# Patient Record
Sex: Male | Born: 1955
Health system: Southern US, Community
[De-identification: ages and names within clinical notes are randomized; demographics above are authoritative.]

## PROBLEM LIST (undated history)

## (undated) ENCOUNTER — Emergency Department (HOSPITAL_COMMUNITY): Admission: EM | Payer: Medicare PPO | Source: Home / Self Care

## (undated) DIAGNOSIS — K219 Gastro-esophageal reflux disease without esophagitis: Secondary | ICD-10-CM

## (undated) DIAGNOSIS — I4891 Unspecified atrial fibrillation: Secondary | ICD-10-CM

## (undated) DIAGNOSIS — I10 Essential (primary) hypertension: Secondary | ICD-10-CM

## (undated) DIAGNOSIS — R079 Chest pain, unspecified: Secondary | ICD-10-CM

## (undated) DIAGNOSIS — Z973 Presence of spectacles and contact lenses: Secondary | ICD-10-CM

## (undated) HISTORY — PX: CHOLECYSTECTOMY: SHX55

## (undated) HISTORY — DX: Gastro-esophageal reflux disease without esophagitis: K21.9

## (undated) HISTORY — DX: Chest pain, unspecified: R07.9

## (undated) HISTORY — DX: Essential (primary) hypertension: I10

---

## 1988-07-13 HISTORY — PX: EXTENSOR TENDON OF FOREARM / WRIST REPAIR: SHX1547

## 2009-07-13 DIAGNOSIS — R079 Chest pain, unspecified: Secondary | ICD-10-CM

## 2009-07-13 HISTORY — DX: Chest pain, unspecified: R07.9

## 2010-03-13 ENCOUNTER — Ambulatory Visit: Payer: Self-pay | Admitting: Internal Medicine

## 2010-03-13 ENCOUNTER — Observation Stay (HOSPITAL_COMMUNITY): Admission: EM | Admit: 2010-03-13 | Discharge: 2010-03-14 | Payer: Self-pay | Admitting: Emergency Medicine

## 2010-04-10 ENCOUNTER — Ambulatory Visit: Payer: Self-pay | Admitting: Cardiology

## 2010-04-10 ENCOUNTER — Encounter: Payer: Self-pay | Admitting: Adult Health

## 2010-08-13 NOTE — Assessment & Plan Note (Signed)
Summary: NPH CHEST PAIN   Visit Type:  Initial Consult Primary Provider:  Dr.Fusco  CC:  chest pain.  History of Present Illness: [name] is a very pleasant 55 y/o CM we are seeing on consultation at the request of Dr. Sherwood Gambler for uncontrolled hypertension.  He has no history of CAD and is not see a cardiologist in the past.  He states he as had high blood pressure for years and is intolerant to several medications.  He was recently admitted to [facility] for hypertensive urgency and chest pain.  He was ruled out for myocardial ischemia and prescribed toprol 25mg  daily and HCTZ 25mg  daily.  He also exhibited symptoms of GERD and was started on priolosec.  He is without complaint today and has been asymptomatic since discharge.   Preventive Screening-Counseling & Management  Alcohol-Tobacco     Alcohol drinks/day: <1     Smoking Status: never  Caffeine-Diet-Exercise     Does Patient Exercise: no  Current Medications (verified): 1)  Aspir-Low 81 Mg Tbec (Aspirin) .... Take 1 Tab Daily 2)  Hydrochlorothiazide 25 Mg Tabs (Hydrochlorothiazide) .... Take 1 Tab Daily 3)  Toprol Xl 25 Mg Xr24h-Tab (Metoprolol Succinate) .... Take 1 Tab Daily  Allergies (verified): 1)  ! * Tetanus  Comments:  Nurse/Medical Assistant: patient brought med list reviewed with patient   Past History:  Past medical, surgical, family and social histories (including risk factors) reviewed, and no changes noted (except as noted below).  Past Medical History: Reviewed history from 04/07/2010 and no changes required. noncardiac chest pain malignant hypertension with element of noncompliance gerds hyperlipidemia  Past Surgical History: Reviewed history from 04/07/2010 and no changes required. no surgical history noted  Family History: Reviewed history from 04/07/2010 and no changes required. Father:myocardial infarction at age 63  Social History: Reviewed history from 04/07/2010 and no changes  required. Full Time(heading and air) Married  Tobacco Use - No.  Alcohol Use - no Regular Exercise - no Drug Use - no Alcohol drinks/day:  <1  Review of Systems       All other systems have been reviewed and are negative unless stated above.   Vital Signs:  Patient profile:   55 year old male Height:      74 inches Weight:      221 pounds BMI:     28.48 Pulse rate:   61 / minute BP sitting:   144 / 88  (right arm)  Vitals Entered By: Dreama Saa, CNA (April 10, 2010 11:32 AM)  Physical Exam  General:  Well developed, well nourished, in no acute distress. Head:  normocephalic and atraumatic Mouth:  Teeth, gums and palate normal. Oral mucosa normal. Lungs:  Clear bilaterally to auscultation and percussion. Heart:  Non-displaced PMI, chest non-tender; regular rate and rhythm, S1, S2 without murmurs, rubs or gallops. Carotid upstroke normal, no bruit. Normal abdominal aortic size, no bruits. Femorals normal pulses, no bruits. Pedals normal pulses. No edema, no varicosities. Abdomen:  Bowel sounds positive; abdomen soft and non-tender without masses, organomegaly, or hernias noted. No hepatosplenomegaly. Msk:  Back normal, normal gait. Muscle strength and tone normal. Pulses:  pulses normal in all 4 extremities Extremities:  No clubbing or cyanosis. Neurologic:  Alert and oriented x 3. Psych:  Normal affect.   EKG  Procedure date:  04/10/2010  Findings:      Normal sinus rhythm with rate of:65 bpm  Left ventricular hypertrophy.    Impression & Recommendations:  Problem # 1:  HYPERTENSION, BENIGN (ICD-401.1) Calvin Fernandez appears to be well controlled at present on current mediations.  He did not have echo or stress test while recently hospitalized and is reluctant to proceed with more testing.  He has been counselled by me and by Dr. Dietrich Pates on need to keep BP well controlled and the effects this can have on his overall health status.  After discussion with Dr.  Dietrich Pates, the patient agrees to proceed with exercise stress test only. We will  also do a urinalysis and BMET to evaluate renal fx and potassium status. Dr. Dietrich Pates has advised him to stop potassium replacement at this time. He will follow up with [place] after tests.  Rx refills for Toprol and HCTZ are provided. His updated medication list for this problem includes:    Aspir-low 81 Mg Tbec (Aspirin) .Marland Kitchen... Take 1 tab daily    Hydrochlorothiazide 25 Mg Tabs (Hydrochlorothiazide) .Marland Kitchen... Take 1 tab daily    Toprol Xl 25 Mg Xr24h-tab (Metoprolol succinate) .Marland Kitchen... Take 1 tab daily  His updated medication list for this problem includes:    Aspir-low 81 Mg Tbec (Aspirin) .Marland Kitchen... Take 1 tab daily    Hydrochlorothiazide 25 Mg Tabs (Hydrochlorothiazide) .Marland Kitchen... Take 1 tab daily    Toprol Xl 25 Mg Xr24h-tab (Metoprolol succinate) .Marland Kitchen... Take 1 tab daily  Other Orders: Treadmill (Treadmill) T-Basic Metabolic Panel [phone]) T-Urinalysis ([id number])  Patient Instructions: 1)  Your physician recommends that you schedule a follow-up appointment in: post stress test 2)  Your physician recommends that you return for lab work in: on day of stress test 3)  Your physician has recommended you make the following change in your medication: Stop taking Potassium 4)  Your physician has requested that you have an exercise tolerance test.  For further information please visit [email or web address]  Please also follow instruction sheet, as given. 5)  PLEASE DO NOT TAKE TOPROL ON MORNING OF STRESS TEST. Prescriptions: TOPROL XL 25 MG XR24H-TAB (METOPROLOL SUCCINATE) take 1 tab daily  #30 x 3   Entered by:   Larita Fife Via LPN   Authorized by:   Joni Reining, NP   Signed by:   Larita Fife Via LPN on 29/56/2130   Method used:   Electronically to        [facility] [address] [phone]* (retail)       [address] [place]  [address]       Ph: [phone]       Fax: [phone]   RxID:    [id number] HYDROCHLOROTHIAZIDE 25 MG TABS (HYDROCHLOROTHIAZIDE) take 1 tab daily  #30 x 3   Entered by:   Larita Fife Via LPN   Authorized by:   Joni Reining, NP   Signed by:   Larita Fife Via LPN on 64/33/2951   Method used:   Electronically to        [facility] [phone]* (retail)       [address] [place], [place]  [address]       Ph: [phone]       Fax: [phone]   RxID:   [id number]   Appended Document: NPH CHEST PAIN Per Dr. Dietrich Pates, pt. is to have nurse visit in 2 months to check BP (pt. checks BP daily).

## 2010-09-25 LAB — POCT CARDIAC MARKERS: Troponin i, poc: <0.05 ng/mL (ref 0.00–0.09)

## 2010-09-25 LAB — COMPREHENSIVE METABOLIC PANEL
AST: 19 U/L (ref 0–37)
Albumin: 3.8 g/dL (ref 3.5–5.2)
Calcium: 9 mg/dL (ref 8.4–10.5)
Creatinine, Ser: 0.94 mg/dL (ref 0.4–1.5)
GFR calc Af Amer: >60 mL/min (ref >60)
Total Protein: 6.5 g/dL (ref 6.0–8.3)

## 2010-09-25 LAB — CK TOTAL AND CKMB (NOT AT ARMC)
CK, MB: 1.1 ng/mL (ref 0.3–4.0)
Relative Index: INVALID (ref 0.0–2.5)
Total CK: 59 U/L (ref 7–232)

## 2010-09-25 LAB — BASIC METABOLIC PANEL
CO2: 29 mEq/L (ref 19–32)
Chloride: 105 mEq/L (ref 96–112)
Creatinine, Ser: 1.02 mg/dL (ref 0.4–1.5)
GFR calc Af Amer: >60 mL/min (ref >60)
Potassium: 4.2 mEq/L (ref 3.5–5.1)

## 2010-09-25 LAB — CARDIAC PANEL(CRET KIN+CKTOT+MB+TROPI)
CK, MB: 1.2 ng/mL (ref 0.3–4.0)
Relative Index: INVALID (ref 0.0–2.5)
Total CK: 53 U/L (ref 7–232)
Troponin I: 0.01 ng/mL (ref 0.00–0.06)

## 2010-09-25 LAB — POCT I-STAT, CHEM 8
BUN: 16 mg/dL (ref 6–23)
Calcium, Ion: 1.14 mmol/L (ref 1.12–1.32)
Chloride: 109 mEq/L (ref 96–112)
Glucose, Bld: 101 mg/dL — ABNORMAL HIGH (ref 70–99)

## 2010-09-25 LAB — CBC
HCT: 42.7 % (ref 39.0–52.0)
MCV: 90.3 fL (ref 78.0–100.0)
RBC: 4.73 MIL/uL (ref 4.22–5.81)
WBC: 6.8 10*3/uL (ref 4.0–10.5)

## 2010-09-25 LAB — LIPID PANEL
Cholesterol: 150 mg/dL (ref 0–200)
HDL: 39 mg/dL — ABNORMAL LOW (ref >39)
LDL Cholesterol: 95 mg/dL (ref 0–99)
Total CHOL/HDL Ratio: 3.8 RATIO
Triglycerides: 79 mg/dL (ref <150)
VLDL: 16 mg/dL (ref 0–40)

## 2010-09-25 LAB — RAPID URINE DRUG SCREEN, HOSP PERFORMED
Barbiturates: NOT DETECTED
Benzodiazepines: NOT DETECTED

## 2010-09-25 LAB — URINALYSIS, ROUTINE W REFLEX MICROSCOPIC
Glucose, UA: NEGATIVE mg/dL
Hgb urine dipstick: NEGATIVE
Protein, ur: NEGATIVE mg/dL
pH: 6 (ref 5.0–8.0)

## 2010-09-25 LAB — DIFFERENTIAL
Eosinophils Relative: 3 % (ref 0–5)
Lymphocytes Relative: 29 % (ref 12–46)
Lymphs Abs: 2 10*3/uL (ref 0.7–4.0)
Monocytes Absolute: 0.6 10*3/uL (ref 0.1–1.0)

## 2010-09-25 LAB — PROTIME-INR
INR: 0.97 (ref 0.00–1.49)
Prothrombin Time: 13.1 seconds (ref 11.6–15.2)

## 2010-09-25 LAB — BRAIN NATRIURETIC PEPTIDE: Pro B Natriuretic peptide (BNP): 65 pg/mL (ref 0.0–100.0)

## 2010-09-25 LAB — PHOSPHORUS: Phosphorus: 3.6 mg/dL (ref 2.3–4.6)

## 2010-09-25 LAB — D-DIMER, QUANTITATIVE: D-Dimer, Quant: <0.22 ug/mL-FEU (ref 0.00–0.48)

## 2010-09-25 LAB — TROPONIN I: Troponin I: 0.01 ng/mL (ref 0.00–0.06)

## 2010-09-25 LAB — TSH: TSH: 1.848 u[IU]/mL (ref 0.350–4.500)

## 2011-07-08 ENCOUNTER — Encounter: Payer: Self-pay | Admitting: Cardiology

## 2011-09-24 ENCOUNTER — Encounter: Payer: Self-pay | Admitting: *Deleted

## 2011-09-24 ENCOUNTER — Encounter: Payer: Self-pay | Admitting: Adult Health

## 2011-09-24 ENCOUNTER — Ambulatory Visit (INDEPENDENT_AMBULATORY_CARE_PROVIDER_SITE_OTHER): Payer: BC Managed Care – HMO | Admitting: Adult Health

## 2011-09-24 VITALS — BP 198/119 | HR 65 | Resp 16 | Ht 74.0 in | Wt 230.0 lb

## 2011-09-24 DIAGNOSIS — I10 Essential (primary) hypertension: Secondary | ICD-10-CM

## 2011-09-24 DIAGNOSIS — I2510 Atherosclerotic heart disease of native coronary artery without angina pectoris: Secondary | ICD-10-CM

## 2011-09-24 MED ORDER — HYDROCHLOROTHIAZIDE 25 MG PO TABS: 25.0000 mg | ORAL_TABLET | Freq: Every day | ORAL | Status: DC

## 2011-09-24 MED ORDER — AMLODIPINE BESYLATE 5 MG PO TABS: 5.0000 mg | ORAL_TABLET | Freq: Every day | ORAL | Status: DC

## 2011-09-24 NOTE — Progress Notes (Signed)
   HPI: Mr. College is a we are seeing for continued assessment and treatment for hypertension. It has been uncontrolled with multiple medication changes per Dr. Sherwood Gambler and our office. He has had allergic reactions to include rash and swelling on ACE, Verapamil, and ARB. He is now only taking metoprolol 25 mg TID.  He did not follow-up and have stress myoview as requested or echocardiogram as requested on last visit. He comes today hypertensive but without complaint.  Allergies  Allergen Reactions  . Tetanus Toxoid     Current Outpatient Prescriptions  Medication Sig Dispense Refill  . metoprolol succinate (TOPROL-XL) 25 MG 24 hr tablet Take 25 mg by mouth 3 (three) times daily.       Marland Kitchen amLODipine (NORVASC) 5 MG tablet Take 1 tablet (5 mg total) by mouth daily.  30 tablet  12  . hydrochlorothiazide (HYDRODIURIL) 25 MG tablet Take 1 tablet (25 mg total) by mouth daily.  30 tablet  12    Past Medical History  Diagnosis Date  . Non-cardiac chest pain   . GERD (gastroesophageal reflux disease)   . Hyperlipidemia   . Hypertension      ZOX:WRUEAV of systems complete and found to be negative unless listed above PHYSICAL EXAM BP 198/119  Pulse 65  Resp 16  Ht 6\' 2"  (1.88 m)  Wt 230 lb (104.327 kg)  BMI 29.53 kg/m2  General: Well developed, well nourished, in no acute distress Head: Eyes PERRLA, No xanthomas.   Normal cephalic and atramatic  Lungs: Clear bilaterally to auscultation and percussion. Heart: HRRR S1 S2, S4..  Pulses are 2+ & equal.            No carotid bruit. No JVD.  No abdominal bruits. No femoral bruits. Abdomen: Bowel sounds are positive, abdomen soft and non-tender without masses or                  Hernia's noted. Msk:  Back normal, normal gait. Normal strength and tone for age. Extremities: No clubbing, cyanosis or edema.  DP +1 Neuro: Alert and oriented X 3. Psych:  Good affect, responds appropriately  EKG: NSR with mild LVH rate of 65 bpm.:  ASSESSMENT AND  PLAN

## 2011-09-24 NOTE — Assessment & Plan Note (Signed)
I have discussed with him the need to have one provider managing his BP. He should not go back and forth as this confuses the issue. He has decided to see [place] for management. I will begin him on HCTZ 25 mg daily and amlodipine 5 mg daily. He will have a BMET and stress echo completed before next appointment.  Further recommendations on next visit depending on his response to treatment and test results.

## 2011-09-24 NOTE — Patient Instructions (Signed)
Your physician recommends that you schedule a follow-up appointment in: 1 week  Your physician has recommended you make the following change in your medication:  1 - START HCTZ 25 mg daily 2 - START Amlodipine 5 mg daily  Your physician has requested that you have a stress echocardiogram. For further information please visit [email or web address] Please follow instruction sheet as given.  Your physician has requested that you regularly monitor and record your blood pressure readings at home. Please use the same machine at the same time of day to check your readings and record them to bring to your follow-up visit.

## 2011-09-30 ENCOUNTER — Ambulatory Visit (HOSPITAL_COMMUNITY)
Admission: RE | Admit: 2011-09-30 | Discharge: 2011-09-30 | Disposition: A | Discharge status: Home / Self Care | Payer: BC Managed Care – PPO | Source: Ambulatory Visit | Attending: Internal Medicine | Admitting: Internal Medicine

## 2011-09-30 DIAGNOSIS — R079 Chest pain, unspecified: Secondary | ICD-10-CM

## 2011-09-30 DIAGNOSIS — I2510 Atherosclerotic heart disease of native coronary artery without angina pectoris: Secondary | ICD-10-CM

## 2011-09-30 DIAGNOSIS — I10 Essential (primary) hypertension: Secondary | ICD-10-CM

## 2011-09-30 NOTE — Progress Notes (Signed)
Stress Lab Nurses Notes - Calvin Fernandez 09/30/2011  Reason for doing test: CAD and HTN Type of test: Stress Echo Nurse performing test: Parke Poisson, RN Nuclear Medicine Tech: Not Applicable Echo Tech: Karrie Doffing MD performing test: Ival Bible & Joni Reining NP Family MD: Lubertha South Test explained and consent signed: yes IV started: No IV started Symptoms: fatigue Treatment/Intervention: None Reason test stopped: fatigue and reached target HR After recovery IV was: NA Patient to return to Nuc. Med at : NA Patient discharged: Home Patient's Condition upon discharge was: stable Comments: During test peak BP 218/100 & HR 157.  Recovery BP 152/98 & HR  108.  Symptoms resolved in recovery. [name] T

## 2011-09-30 NOTE — Progress Notes (Signed)
*  PRELIMINARY RESULTS* Echocardiogram Echocardiogram Stress Test has been performed.  Calvin Fernandez 09/30/2011, 10:49 AM

## 2011-10-05 ENCOUNTER — Encounter: Payer: Self-pay | Admitting: Adult Health

## 2011-10-05 ENCOUNTER — Other Ambulatory Visit: Payer: Self-pay | Admitting: Adult Health

## 2011-10-05 ENCOUNTER — Ambulatory Visit (INDEPENDENT_AMBULATORY_CARE_PROVIDER_SITE_OTHER): Payer: BC Managed Care – PPO | Admitting: Adult Health

## 2011-10-05 VITALS — BP 159/96 | HR 96 | Resp 16 | Ht 74.0 in | Wt 222.0 lb

## 2011-10-05 DIAGNOSIS — R079 Chest pain, unspecified: Secondary | ICD-10-CM

## 2011-10-05 DIAGNOSIS — I10 Essential (primary) hypertension: Secondary | ICD-10-CM

## 2011-10-05 NOTE — Assessment & Plan Note (Signed)
No recurrence of chest pain or discomfort even with exertion.

## 2011-10-05 NOTE — Patient Instructions (Signed)
Your physician recommends that you schedule a follow-up appointment in: 3 months  Your physician recommends that you return for lab work in: Today   

## 2011-10-05 NOTE — Assessment & Plan Note (Signed)
Blood pressure is significantly better with new medications at current doses. Stress echo was negative for ischemia, and as stated above, he did have a hypertensive response. He is very difficult to give medications to because he often breaks out in a rash. I will not make any changes in his medications at this time as he seem to be tolerating the current regimen. He brought with him the BP recordings at home and they are running 134-150 systolic.  If necessary, will consider adding back a low dose BB if BP is elevated on next visit.  He will return in 3 months.

## 2011-10-05 NOTE — Progress Notes (Signed)
   HPI: Calvin Fernandez is a we are seeing for continued assessment and treatment for hypertension. It has been uncontrolled with multiple medication changes per Dr. Sherwood Gambler and our office. He has had allergic reactions to include rash and swelling on ACE, Verapamil, and ARB.  On last visit, he was planned for stress echo. He was also started on norvasc 5 mg and HCTZ 25 mg daily.  I was present at the stress test and he was found to have a hypertensive response. I increased the norvasc to 10 mg. He has tolerated the medication with rash or dizziness.  Allergies  Allergen Reactions  . Tetanus Toxoid     Current Outpatient Prescriptions  Medication Sig Dispense Refill  . amLODipine (NORVASC) 10 MG tablet Take 10 mg by mouth daily.      Marland Kitchen aspirin 81 MG tablet Take 81 mg by mouth daily.      . hydrochlorothiazide (HYDRODIURIL) 25 MG tablet Take 1 tablet (25 mg total) by mouth daily.  30 tablet  12    Past Medical History  Diagnosis Date  . Non-cardiac chest pain   . GERD (gastroesophageal reflux disease)   . Hyperlipidemia   . Hypertension      QVZ:DGLOVF of systems complete and found to be negative unless listed above PHYSICAL EXAM BP 159/96  Pulse 96  Resp 16  Ht 6\' 2"  (1.88 m)  Wt 222 lb (100.699 kg)  BMI 28.50 kg/m2  General: Well developed, well nourished, in no acute distress Head: Eyes PERRLA, No xanthomas.   Normal cephalic and atramatic  Lungs: Clear bilaterally to auscultation and percussion. Heart: HRRR S1 S2,..  Pulses are 2+ & equal.            No carotid bruit. No JVD.  No abdominal bruits. No femoral bruits. Abdomen: Bowel sounds are positive, abdomen soft and non-tender without masses or                  Hernia's noted. Msk:  Back normal, normal gait. Normal strength and tone for age. Extremities: No clubbing, cyanosis or edema.  DP +1 Neuro: Alert and oriented X 3. Psych:  Good affect, responds appropriately  EKG: NSR with mild LVH rate of 65 bpm.:  ASSESSMENT  AND PLAN

## 2011-10-06 ENCOUNTER — Encounter: Payer: Self-pay | Admitting: *Deleted

## 2011-10-06 LAB — BASIC METABOLIC PANEL
BUN: 17 mg/dL (ref 6–23)
Calcium: 9.4 mg/dL (ref 8.4–10.5)
Chloride: 99 mEq/L (ref 96–112)
Creat: 1.22 mg/dL (ref 0.50–1.35)

## 2011-10-07 ENCOUNTER — Telehealth: Payer: Self-pay | Admitting: Adult Health

## 2011-10-07 MED ORDER — AMLODIPINE BESYLATE 10 MG PO TABS: 10.0000 mg | ORAL_TABLET | Freq: Every day | ORAL | Status: DC

## 2011-10-07 NOTE — Telephone Encounter (Signed)
RX sent into pharmacy. Pt notified. 

## 2011-10-07 NOTE — Telephone Encounter (Signed)
Pt needs amlodipine called in to [facility] the dose was changed at yesterdays visit/tmj

## 2012-01-05 ENCOUNTER — Ambulatory Visit: Payer: BC Managed Care – PPO | Admitting: Adult Health

## 2012-01-05 ENCOUNTER — Encounter: Payer: Self-pay | Admitting: Adult Health

## 2012-01-05 ENCOUNTER — Ambulatory Visit (INDEPENDENT_AMBULATORY_CARE_PROVIDER_SITE_OTHER): Payer: BC Managed Care – PPO | Admitting: Adult Health

## 2012-01-05 VITALS — BP 152/95 | HR 80 | Resp 18 | Ht 74.0 in | Wt 226.0 lb

## 2012-01-05 DIAGNOSIS — I10 Essential (primary) hypertension: Secondary | ICD-10-CM

## 2012-01-05 MED ORDER — METOPROLOL TARTRATE 25 MG PO TABS: 12.5000 mg | ORAL_TABLET | Freq: Two times a day (BID) | ORAL | Status: DC

## 2012-01-05 MED ORDER — AMLODIPINE BESYLATE 5 MG PO TABS: 5.0000 mg | ORAL_TABLET | Freq: Every day | ORAL | Status: DC

## 2012-01-05 MED ORDER — METOPROLOL TARTRATE 25 MG PO TABS: 25.0000 mg | ORAL_TABLET | Freq: Two times a day (BID) | ORAL | Status: DC

## 2012-01-05 NOTE — Patient Instructions (Addendum)
Your physician recommends that you schedule a follow-up appointment in: 1 month with Dr Dietrich Pates  Your physician recommends that you return for lab work in: This week  Your physician has recommended you make the following change in your medication:  1 - DECREASE Lopressor to 12.5 mg twice a day 2 - DECREASE Norvac to 5 mg dialy

## 2012-01-05 NOTE — Progress Notes (Signed)
   HPI: [name] is a 56 y/o patient of Dr. Dietrich Pates we are seeing for ongoing assessment and treatment of hypertension, with multiple medication changes due to intolerance of ARB, ACE causing rashes and swelling. He was also unable to tolerate verapamil for self described rash. He was placed on Norvasc 10 mg and has been doing well until recently with complaints of BP mildly elevated, LEE and sexual dysfunction within the last month. He wishes to stop the norvasc and try something else. He denies chest pain or DOE. Edema usually is gone in am and increases in the pm.  Allergies  Allergen Reactions  . Tetanus Toxoid     Current Outpatient Prescriptions  Medication Sig Dispense Refill  . aspirin 81 MG tablet Take 81 mg by mouth daily.      . hydrochlorothiazide (HYDRODIURIL) 25 MG tablet Take 1 tablet (25 mg total) by mouth daily.  30 tablet  12  . DISCONTD: amLODipine (NORVASC) 10 MG tablet Take 1 tablet (10 mg total) by mouth daily.  30 tablet  6  . amLODipine (NORVASC) 5 MG tablet Take 1 tablet (5 mg total) by mouth daily.  30 tablet  12  . metoprolol tartrate (LOPRESSOR) 25 MG tablet Take 0.5 tablets (12.5 mg total) by mouth 2 (two) times daily.  30 tablet  12  . DISCONTD: metoprolol succinate (TOPROL-XL) 25 MG 24 hr tablet Take 25 mg by mouth 3 (three) times daily.       Marland Kitchen DISCONTD: metoprolol tartrate (LOPRESSOR) 25 MG tablet Take 1 tablet (25 mg total) by mouth 2 (two) times daily.  30 tablet  12    Past Medical History  Diagnosis Date  . Non-cardiac chest pain   . GERD (gastroesophageal reflux disease)   . Hyperlipidemia   . Hypertension     No past surgical history on file.  FAO:ZHYQMV of systems complete and found to be negative unless listed above  PHYSICAL EXAM BP 152/95  Pulse 80  Resp 18  Ht 6\' 2"  (1.88 m)  Wt 226 lb (102.513 kg)  BMI 29.02 kg/m2  General: Well developed, well nourished, in no acute distress Head: Eyes PERRLA, No xanthomas.   Normal cephalic  and atramatic  Lungs: Clear bilaterally to auscultation and percussion. Heart: HRRR S1 S2, without MRG.  Pulses are 2+ & equal.            No carotid bruit. No JVD.  No abdominal bruits. No femoral bruits. Abdomen: Bowel sounds are positive, abdomen soft and non-tender without masses or                  Hernia's noted. Msk:  Back normal, normal gait. Normal strength and tone for age. Extremities: No clubbing, cyanosis, non-pitting  Edema noted in the ankles.  DP +1 Neuro: Alert and oriented X 3. Psych:  Good affect, responds appropriately    ASSESSMENT AND PLAN

## 2012-01-05 NOTE — Assessment & Plan Note (Signed)
He wishes to stop norvasc as he is convinced that this is affecting sexual performance. I will decrease the dose to 5 mg daily and add low dose BB lopressor 12.5 mg BID. Although BB can cause decreased libido, do not think at this dose that he will find this to be true. He is given samples of Cilias. I have suggested referral to urologist should this become more of an issue for him.  He will see Dr. Dietrich Pates on next visit, as he has not been seen by him.

## 2012-01-06 LAB — BASIC METABOLIC PANEL
Chloride: 100 mEq/L (ref 96–112)
Potassium: 3.9 mEq/L (ref 3.5–5.3)
Sodium: 140 mEq/L (ref 135–145)

## 2012-02-10 ENCOUNTER — Encounter: Payer: Self-pay | Admitting: *Deleted

## 2012-02-10 ENCOUNTER — Encounter: Payer: Self-pay | Admitting: Cardiology

## 2012-02-10 ENCOUNTER — Ambulatory Visit (INDEPENDENT_AMBULATORY_CARE_PROVIDER_SITE_OTHER): Payer: BC Managed Care – PPO | Admitting: Cardiology

## 2012-02-10 VITALS — BP 140/90 | HR 61 | Ht 74.0 in | Wt 228.0 lb

## 2012-02-10 DIAGNOSIS — I10 Essential (primary) hypertension: Secondary | ICD-10-CM | POA: Insufficient documentation

## 2012-02-10 DIAGNOSIS — R079 Chest pain, unspecified: Secondary | ICD-10-CM

## 2012-02-10 DIAGNOSIS — K219 Gastro-esophageal reflux disease without esophagitis: Secondary | ICD-10-CM | POA: Insufficient documentation

## 2012-02-10 NOTE — Progress Notes (Signed)
Patient ID: Calvin Fernandez, male   DOB: 1955/11/10, 56 y.o.   MRN: [id number]  HPI: Scheduled return visit for this nice gentleman with hypertension.  Control this process has been difficult due to adverse effects, many of which have not been previously reported, to multiple medications.  He has done well on his current regimen, experiencing only pain in the soles of his feet and erectile dysfunction.  The latter has resolved with a reduction in his dose of metoprolol.  Blood pressure are followed at home with typical systolics of 135 and diastolics of 90 or less.  Prior to Admission medications   Medication Sig Start Date End Date Taking? Authorizing Provider  amLODipine (NORVASC) 5 MG tablet Take 1 tablet (5 mg total) by mouth daily. 01/05/12 01/04/13 Yes Jodelle Gross, NP  aspirin 81 MG tablet Take 81 mg by mouth daily.   Yes Historical Provider, MD  hydrochlorothiazide (HYDRODIURIL) 25 MG tablet Take 1 tablet (25 mg total) by mouth daily. 09/24/11  Yes Jodelle Gross, NP  metoprolol tartrate (LOPRESSOR) 25 MG tablet Take 0.5 tablets (12.5 mg total) by mouth 2 (two) times daily. 01/05/12 01/04/13 Yes Jodelle Gross, NP   Allergies  Allergen Reactions  . Tetanus Toxoid      Past medical history, social history, and family history reviewed and updated.  ROS: Denies nausea, emesis, dyspnea, chest discomfort.  PHYSICAL EXAM: BP 140/90  Pulse 61  Ht 6\' 2"  (1.88 m)  Wt 103.42 kg (228 lb)  BMI 29.27 kg/m2  SpO2 95%  General-Well developed; no acute distress Body habitus-Mildly overweight Neck-No JVD; no carotid bruits Lungs-clear lung fields; resonant to percussion Cardiovascular-normal PMI; normal S1 and S2; S4 present Abdomen-normal bowel sounds; soft and non-tender without masses or organomegaly Musculoskeletal-No deformities, no cyanosis or clubbing Neurologic-Normal cranial nerves; symmetric strength and tone Skin-Warm, no significant lesions Extremities-distal pulses  intact; Trace edema  ASSESSMENT AND PLAN:  [name], MD 02/10/2012 3:58 PM

## 2012-02-10 NOTE — Patient Instructions (Addendum)
Your physician recommends that you schedule a follow-up appointment in: 8 months  Low sodium diet Information provided on foods high in potassium  Increase exercise  Your physician recommends that you return for lab work in: 4 months

## 2012-02-10 NOTE — Progress Notes (Deleted)
Name: Calvin Fernandez    DOB: Dec 06, 1955  Age: 56 y.o.  MR#: [id number]       PCP:  Harlow Asa, MD      Insurance: @PAYORNAME @   CC:    Chief Complaint  Patient presents with  . Hypertension    No complaints/  TC  Med list    VS BP 140/90  Pulse 61  Ht 6\' 2"  (1.88 m)  Wt 228 lb (103.42 kg)  BMI 29.27 kg/m2  SpO2 95%  Weights Current Weight  02/10/12 228 lb (103.42 kg)  01/05/12 226 lb (102.513 kg)  10/05/11 222 lb (100.699 kg)    Blood Pressure  BP Readings from Last 3 Encounters:  02/10/12 140/90  01/05/12 152/95  10/05/11 159/96     Admit date:  (Not on file) Last encounter with RMR:  Visit date not found   Allergy Allergies  Allergen Reactions  . Tetanus Toxoid     Current Outpatient Prescriptions  Medication Sig Dispense Refill  . amLODipine (NORVASC) 5 MG tablet Take 1 tablet (5 mg total) by mouth daily.  30 tablet  12  . aspirin 81 MG tablet Take 81 mg by mouth daily.      . hydrochlorothiazide (HYDRODIURIL) 25 MG tablet Take 1 tablet (25 mg total) by mouth daily.  30 tablet  12  . metoprolol tartrate (LOPRESSOR) 25 MG tablet Take 0.5 tablets (12.5 mg total) by mouth 2 (two) times daily.  30 tablet  12  . DISCONTD: metoprolol succinate (TOPROL-XL) 25 MG 24 hr tablet Take 25 mg by mouth 3 (three) times daily.         Discontinued Meds:   There are no discontinued medications.  Patient Active Problem List  Diagnosis  . HYPERTENSION, BENIGN  . HYPERTENSION  . CHEST PAIN  . Chest pain  . GERD (gastroesophageal reflux disease)  . Hyperlipidemia  . Hypertension    LABS Office Visit on 01/05/2012  Component Date Value  . Sodium 01/05/2012 140   . Potassium 01/05/2012 3.9   . Chloride 01/05/2012 100   . CO2 01/05/2012 28   . Glucose, Bld 01/05/2012 74   . BUN 01/05/2012 11   . Creat 01/05/2012 1.09   . Calcium 01/05/2012 9.8      Results for this Opt Visit:     Results for orders placed in visit on 01/05/12  BASIC METABOLIC PANEL   Component Value Range   Sodium 140  135 - 145 mEq/L   Potassium 3.9  3.5 - 5.3 mEq/L   Chloride 100  96 - 112 mEq/L   CO2 28  19 - 32 mEq/L   Glucose, Bld 74  70 - 99 mg/dL   BUN 11  6 - 23 mg/dL   Creat 4.09  8.11 - 9.14 mg/dL   Calcium 9.8  8.4 - 78.2 mg/dL    EKG Orders placed in visit on 09/24/11  . EKG 12-LEAD     Prior Assessment and Plan Problem List as of 02/10/2012            Cardiology Problems   HYPERTENSION, BENIGN   Last Assessment & Plan Note   09/24/2011 Office Visit Signed 09/24/2011  1:13 PM by Jodelle Gross, NP    I have discussed with him the need to have one provider managing his BP. He should not go back and forth as this confuses the issue. He has decided to see [place] for management. I will begin him on HCTZ 25 mg  daily and amlodipine 5 mg daily. He will have a BMET and stress echo completed before next appointment.  Further recommendations on next visit depending on his response to treatment and test results.    HYPERTENSION   Last Assessment & Plan Note   01/05/2012 Office Visit Signed 01/05/2012  3:57 PM by Jodelle Gross, NP    He wishes to stop norvasc as he is convinced that this is affecting sexual performance. I will decrease the dose to 5 mg daily and add low dose BB lopressor 12.5 mg BID. Although BB can cause decreased libido, do not think at this dose that he will find this to be true. He is given samples of Cilias. I have suggested referral to urologist should this become more of an issue for him.  He will see Dr. Dietrich Pates on next visit, as he has not been seen by him.     Hyperlipidemia   Hypertension     Other   CHEST PAIN   Last Assessment & Plan Note   10/05/2011 Office Visit Signed 10/05/2011  3:44 PM by Jodelle Gross, NP    No recurrence of chest pain or discomfort even with exertion.    Chest pain   GERD (gastroesophageal reflux disease)       Imaging: No results found.   FRS Calculation: Score not calculated. Missing:  Total Cholesterol

## 2012-02-10 NOTE — Assessment & Plan Note (Addendum)
Hypertension finally appears to be under adequate control.  Current medications will be continued.  Patient will call should he experience significant elevations.  Otherwise, a return visit is anticipated in 8 months.

## 2012-02-10 NOTE — Assessment & Plan Note (Signed)
Patient has had few problems with chest discomfort since he was seen for this problem in 2011.

## 2012-03-07 ENCOUNTER — Encounter (HOSPITAL_COMMUNITY): Payer: Self-pay | Admitting: Cardiology

## 2012-06-08 ENCOUNTER — Other Ambulatory Visit: Payer: Self-pay | Admitting: *Deleted

## 2012-06-08 DIAGNOSIS — I10 Essential (primary) hypertension: Secondary | ICD-10-CM

## 2012-07-11 ENCOUNTER — Encounter: Payer: Self-pay | Admitting: *Deleted

## 2012-07-25 ENCOUNTER — Other Ambulatory Visit: Payer: Self-pay | Admitting: Cardiology

## 2012-07-26 LAB — BASIC METABOLIC PANEL
BUN: 16 mg/dL (ref 6–23)
CO2: 30 mEq/L (ref 19–32)
Chloride: 101 mEq/L (ref 96–112)
Glucose, Bld: 77 mg/dL (ref 70–99)
Potassium: 4.3 mEq/L (ref 3.5–5.3)
Sodium: 140 mEq/L (ref 135–145)

## 2012-09-30 ENCOUNTER — Encounter: Payer: Self-pay | Admitting: Cardiology

## 2012-09-30 ENCOUNTER — Ambulatory Visit (INDEPENDENT_AMBULATORY_CARE_PROVIDER_SITE_OTHER): Payer: BC Managed Care – PPO | Admitting: Cardiology

## 2012-09-30 VITALS — BP 134/88 | HR 96 | Ht 74.0 in | Wt 224.5 lb

## 2012-09-30 DIAGNOSIS — I10 Essential (primary) hypertension: Secondary | ICD-10-CM

## 2012-09-30 NOTE — Patient Instructions (Addendum)
Your physician recommends that you schedule a follow-up appointment in: 1 year  Stool cards x 3 and return to [place]  Your physician recommends that you return for lab work in: 6 months

## 2012-09-30 NOTE — Progress Notes (Signed)
Patient ID: Calvin Fernandez, male   DOB: September 09, 1955, 57 y.o.   MRN: [id number]  HPI: Schedule return visit for this very pleasant gentleman who enjoys generally excellent health, but has mild hypertension for which it has been difficult to find an effective medical regime that is well tolerated. Over the past few months, the only issue he has noted is dry eyes and difficulty wearing his contacts. He has not yet consulted his ophthalmologist for assessment of this problem.  He monitors blood pressure at home, which has been consistently below 140/90.  He has never had colonoscopy, but does not wish to consider that procedure. He does not routinely accept vaccinations.  Current Outpatient Prescriptions  Medication Sig Dispense Refill  . amLODipine (NORVASC) 5 MG tablet Take 1 tablet (5 mg total) by mouth daily.  30 tablet  12  . aspirin 81 MG tablet Take 81 mg by mouth daily.      . hydrochlorothiazide (HYDRODIURIL) 25 MG tablet Take 1 tablet (25 mg total) by mouth daily.  30 tablet  12  . metoprolol tartrate (LOPRESSOR) 25 MG tablet Take 0.5 tablets (12.5 mg total) by mouth 2 (two) times daily.  30 tablet  12  . [DISCONTINUED] metoprolol succinate (TOPROL-XL) 25 MG 24 hr tablet Take 25 mg by mouth 3 (three) times daily.        No current facility-administered medications for this visit.   Allergies  Allergen Reactions  . Tetanus Toxoid      Past medical history, social history, and family history reviewed and updated.  ROS: Denies chest pain or dyspnea.  PHYSICAL EXAM: BP 134/88  Pulse 96  Ht 6\' 2"  (1.88 m)  Wt 101.833 kg (224 lb 8 oz)  BMI 28.81 kg/m2;  Body mass index is 28.81 kg/(m^2). General-Well developed; no acute distress Body habitus-proportionate weight and height Neck-No JVD; no carotid bruits Lungs-clear lung fields; resonant to percussion Cardiovascular-normal PMI; normal S1 and S2 Abdomen-normal bowel sounds; soft and non-tender without masses or  organomegaly Musculoskeletal-No deformities, no cyanosis or clubbing Neurologic-Normal cranial nerves; symmetric strength and tone Skin-Warm, no significant lesions Extremities-distal pulses intact; no edema  [place], MD 09/30/2012  3:31 PM  ASSESSMENT AND PLAN

## 2012-09-30 NOTE — Assessment & Plan Note (Signed)
Patient encouraged to consider at least a few health maintenance activities and advised to seek at least annual evaluation by Dr. Gerda Diss. Stool Hemoccult cards provided to him.  Control of hypertension is adequate with current regime, which will be continued. Chemistry profile to be obtained every 6 months with a return office visit in one year.

## 2012-09-30 NOTE — Progress Notes (Deleted)
Name: Calvin Fernandez    DOB: Dec 05, 1955  Age: 57 y.o.  MR#: [id number]       PCP:  Harlow Asa, MD      Insurance: Payor: [employer]UE [employer]  Plan: BC[employer] PPO  Product Type: *No Product type*    CC:   PT NOTES CONCERN WITH POSSIBLE INTERACTION OF BP MEDICATIONS CAUSING DRY EYE SYNDROME BUT UNABLE TO WEAR HIS CONTACTS IN 6-8 MONTHS PER TOO IRRITATED TO WEAR NOTES WEARING FOR OVER 30 YEARS AND TRIED USING NEW CONTACTS TWICE TO MAKE SURE THAT WAS NOT THE ISSUE AND WANTS TO DISCUSS POSSIBILITY    VS Filed Vitals:   09/30/12 1454  BP: 134/88  Pulse: 96  Height: 6\' 2"  (1.88 m)  Weight: 224 lb 8 oz (101.833 kg)    Weights Current Weight  09/30/12 224 lb 8 oz (101.833 kg)  02/10/12 228 lb (103.42 kg)  01/05/12 226 lb (102.513 kg)    Blood Pressure  BP Readings from Last 3 Encounters:  09/30/12 134/88  02/10/12 140/90  01/05/12 152/95     Admit date:  (Not on file) Last encounter with RMR:  Visit date not found   Allergy Tetanus toxoid  Current Outpatient Prescriptions  Medication Sig Dispense Refill  . amLODipine (NORVASC) 5 MG tablet Take 1 tablet (5 mg total) by mouth daily.  30 tablet  12  . aspirin 81 MG tablet Take 81 mg by mouth daily.      . hydrochlorothiazide (HYDRODIURIL) 25 MG tablet Take 1 tablet (25 mg total) by mouth daily.  30 tablet  12  . metoprolol tartrate (LOPRESSOR) 25 MG tablet Take 0.5 tablets (12.5 mg total) by mouth 2 (two) times daily.  30 tablet  12  . [DISCONTINUED] metoprolol succinate (TOPROL-XL) 25 MG 24 hr tablet Take 25 mg by mouth 3 (three) times daily.        No current facility-administered medications for this visit.    Discontinued Meds:   There are no discontinued medications.  Patient Active Problem List  Diagnosis  . Chest pain  . GERD (gastroesophageal reflux disease)  . Hypertension    LABS    Component Value Date/Time   NA 140 07/25/2012 1633   NA 140 01/05/2012 1810   NA 141 10/05/2011 1510   K 4.3 07/25/2012  1633   K 3.9 01/05/2012 1810   K 3.8 10/05/2011 1510   CL 101 07/25/2012 1633   CL 100 01/05/2012 1810   CL 99 10/05/2011 1510   CO2 30 07/25/2012 1633   CO2 28 01/05/2012 1810   CO2 29 10/05/2011 1510   GLUCOSE 77 07/25/2012 1633   GLUCOSE 74 01/05/2012 1810   GLUCOSE 87 10/05/2011 1510   BUN 16 07/25/2012 1633   BUN 11 01/05/2012 1810   BUN 17 10/05/2011 1510   CREATININE 0.99 07/25/2012 1633   CREATININE 1.09 01/05/2012 1810   CREATININE 1.22 10/05/2011 1510   CREATININE 1.02 03/14/2010 0515   CREATININE 0.94 03/13/2010 0941   CREATININE 1.0 03/13/2010 0513   CALCIUM 9.6 07/25/2012 1633   CALCIUM 9.8 01/05/2012 1810   CALCIUM 9.4 10/05/2011 1510   GFRNONAA >60 03/14/2010 0515   GFRNONAA >60 03/13/2010 0941   GFRAA  Value: >60        The eGFR has been calculated using the MDRD equation. This calculation has not been validated in all clinical situations. eGFR's persistently <60 mL/min signify possible Chronic Kidney Disease. 03/14/2010 0515   GFRAA  Value: >60  The eGFR has been calculated using the MDRD equation. This calculation has not been validated in all clinical situations. eGFR's persistently <60 mL/min signify possible Chronic Kidney Disease. 03/13/2010 0941   CMP     Component Value Date/Time   NA 140 07/25/2012 1633   K 4.3 07/25/2012 1633   CL 101 07/25/2012 1633   CO2 30 07/25/2012 1633   GLUCOSE 77 07/25/2012 1633   BUN 16 07/25/2012 1633   CREATININE 0.99 07/25/2012 1633   CREATININE 1.02 03/14/2010 0515   CALCIUM 9.6 07/25/2012 1633   PROT 6.5 03/13/2010 0941   ALBUMIN 3.8 03/13/2010 0941   AST 19 03/13/2010 0941   ALT 15 03/13/2010 0941   ALKPHOS 46 03/13/2010 0941   BILITOT 0.9 03/13/2010 0941   GFRNONAA >60 03/14/2010 0515   GFRAA  Value: >60        The eGFR has been calculated using the MDRD equation. This calculation has not been validated in all clinical situations. eGFR's persistently <60 mL/min signify possible Chronic Kidney Disease. 03/14/2010 0515       Component Value Date/Time   WBC 6.8  03/13/2010 0457   HGB 15.3 03/13/2010 0513   HGB 15.1 03/13/2010 0457   HCT 45.0 03/13/2010 0513   HCT 42.7 03/13/2010 0457   MCV 90.3 03/13/2010 0457    Lipid Panel     Component Value Date/Time   CHOL  Value: 150        ATP III CLASSIFICATION:  <200     mg/dL   Desirable  454-098  mg/dL   Borderline High  >=119    mg/dL   High        07/17/7827 0515   TRIG 79 03/14/2010 0515   HDL 39* 03/14/2010 0515   CHOLHDL 3.8 03/14/2010 0515   VLDL 16 03/14/2010 0515   LDLCALC  Value: 95        Total Cholesterol/HDL:CHD Risk Coronary Heart Disease Risk Table                     Men   Women  1/2 Average Risk   3.4   3.3  Average Risk       5.0   4.4  2 X Average Risk   9.6   7.1  3 X Average Risk  23.4   11.0        Use the calculated Patient Ratio above and the CHD Risk Table to determine the patient's CHD Risk.        ATP III CLASSIFICATION (LDL):  <100     mg/dL   Optimal  562-130  mg/dL   Near or Above                    Optimal  130-159  mg/dL   Borderline  865-784  mg/dL   High  >696     mg/dL   Very High 08/21/5282 1324    ABG    Component Value Date/Time   TCO2 25 03/13/2010 0513     Lab Results  Component Value Date   TSH 1.848 03/13/2010   BNP (last 3 results) No results found for this basename: PROBNP,  in the last 8760 hours Cardiac Panel (last 3 results) No results found for this basename: CKTOTAL, CKMB, TROPONINI, RELINDX,  in the last 72 hours  Iron/TIBC/Ferritin No results found for this basename: iron, tibc, ferritin     EKG Orders placed in visit on 09/24/11  . EKG 12-LEAD  Prior Assessment and Plan Problem List as of 09/30/2012     ICD-9-CM   Chest pain   Last Assessment & Plan   02/10/2012 Office Visit Written 02/10/2012  4:01 PM by Kathlen Brunswick, MD     Patient has had few problems with chest discomfort since he was seen for this problem in 2011.    GERD (gastroesophageal reflux disease)   Hypertension   Last Assessment & Plan   02/10/2012 Office Visit Edited 02/13/2012 11:04 AM by  Kathlen Brunswick, MD     Hypertension finally appears to be under adequate control.  Current medications will be continued.  Patient will call should he experience significant elevations.  Otherwise, a return visit is anticipated in 8 months.        Imaging: No results found.

## 2012-10-02 ENCOUNTER — Other Ambulatory Visit: Payer: Self-pay | Admitting: Adult Health

## 2012-10-03 ENCOUNTER — Other Ambulatory Visit: Payer: Self-pay | Admitting: Cardiology

## 2012-10-06 ENCOUNTER — Other Ambulatory Visit: Payer: Self-pay | Admitting: *Deleted

## 2012-10-06 ENCOUNTER — Encounter: Payer: Self-pay | Admitting: *Deleted

## 2012-10-06 DIAGNOSIS — I10 Essential (primary) hypertension: Secondary | ICD-10-CM

## 2012-11-15 ENCOUNTER — Encounter: Payer: Self-pay | Admitting: Cardiology

## 2012-11-20 ENCOUNTER — Other Ambulatory Visit: Payer: Self-pay | Admitting: Cardiology

## 2012-11-20 ENCOUNTER — Other Ambulatory Visit: Payer: Self-pay | Admitting: Adult Health

## 2012-12-29 ENCOUNTER — Other Ambulatory Visit: Payer: Self-pay | Admitting: Cardiology

## 2013-01-03 ENCOUNTER — Telehealth: Payer: Self-pay

## 2013-01-03 ENCOUNTER — Other Ambulatory Visit: Payer: Self-pay | Admitting: *Deleted

## 2013-01-03 MED ORDER — AMLODIPINE BESYLATE 5 MG PO TABS: 5.0000 mg | ORAL_TABLET | Freq: Every day | ORAL | Status: DC

## 2013-01-03 MED ORDER — METOPROLOL TARTRATE 25 MG PO TABS: ORAL_TABLET | ORAL | Status: DC

## 2013-01-03 MED ORDER — HYDROCHLOROTHIAZIDE 25 MG PO TABS: 25.0000 mg | ORAL_TABLET | Freq: Every day | ORAL | Status: DC

## 2013-01-03 NOTE — Telephone Encounter (Signed)
Medication sent via escribe for amlodipine.

## 2013-01-03 NOTE — Telephone Encounter (Signed)
Medication sent via escribe.  

## 2013-01-03 NOTE — Telephone Encounter (Signed)
[facility] CALLED FOR SAME REQUEST BELOW. THANKS

## 2013-01-03 NOTE — Telephone Encounter (Signed)
Patient called and states he is out of medications for BP.  He requests refills and qty 90 hydrochlorothiazide (HYDRODIURIL) 25 MG tablet (just received, please make sure qty is correct.  Also, this is on the med list twice) amLODipine (NORVASC) 5 MG tablet metoprolol tartrate (LOPRESSOR) 25 MG tablet  Patient would like to pick up tomorrow.  Please call in today.

## 2013-03-07 ENCOUNTER — Other Ambulatory Visit: Payer: Self-pay | Admitting: *Deleted

## 2013-03-07 ENCOUNTER — Encounter: Payer: Self-pay | Admitting: *Deleted

## 2013-03-07 DIAGNOSIS — I10 Essential (primary) hypertension: Secondary | ICD-10-CM

## 2013-04-11 LAB — BASIC METABOLIC PANEL
BUN: 18 mg/dL (ref 6–23)
CO2: 33 mEq/L — ABNORMAL HIGH (ref 19–32)
Calcium: 9.6 mg/dL (ref 8.4–10.5)
Glucose, Bld: 74 mg/dL (ref 70–99)
Sodium: 139 mEq/L (ref 135–145)

## 2013-04-13 ENCOUNTER — Encounter: Payer: Self-pay | Admitting: *Deleted

## 2013-09-28 ENCOUNTER — Encounter: Payer: Self-pay | Admitting: Cardiology

## 2013-10-16 ENCOUNTER — Ambulatory Visit: Payer: BC Managed Care – PPO | Admitting: Cardiology

## 2013-11-17 ENCOUNTER — Ambulatory Visit (INDEPENDENT_AMBULATORY_CARE_PROVIDER_SITE_OTHER): Payer: BC Managed Care – PPO | Admitting: Cardiology

## 2013-11-17 ENCOUNTER — Encounter: Payer: Self-pay | Admitting: Cardiology

## 2013-11-17 VITALS — BP 137/86 | HR 69 | Ht 74.0 in | Wt 208.0 lb

## 2013-11-17 DIAGNOSIS — I10 Essential (primary) hypertension: Secondary | ICD-10-CM

## 2013-11-17 NOTE — Progress Notes (Signed)
     Clinical Summary [name] is a 58 y.o.male former patient of Dr Dietrich Patesothbart, this is our first visit together. He is seen for the following medical problemss  1. HTN - from notes has been difficult to control due to side effects of various medications, these are not well described in his chart.  - checks occasionally, typically 130s/80s  Past Medical History  Diagnosis Date  . Chest pain 2011    mild aortic plaque on CT of abdomen; negative stress echocardiogram in 09/2011  . GERD (gastroesophageal reflux disease)   . Hypertension     multiple drug intolerances; noncompliance     Allergies  Allergen Reactions  . Tetanus Toxoid      Current Outpatient Prescriptions  Medication Sig Dispense Refill  . amLODipine (NORVASC) 5 MG tablet Take 1 tablet (5 mg total) by mouth daily.  90 tablet  3  . aspirin 81 MG tablet Take 81 mg by mouth daily.      . hydrochlorothiazide (HYDRODIURIL) 25 MG tablet Take 1 tablet (25 mg total) by mouth daily.  90 tablet  3  . metoprolol tartrate (LOPRESSOR) 25 MG tablet TAKE 1/2 TABLET BY MOUTH TWICE DAILY  90 tablet  3  . [DISCONTINUED] metoprolol succinate (TOPROL-XL) 25 MG 24 hr tablet Take 25 mg by mouth 3 (three) times daily.        No current facility-administered medications for this visit.     No past surgical history on file.   Allergies  Allergen Reactions  . Tetanus Toxoid       Family History  Problem Relation Age of Onset  . Heart attack Father 5555     Social History [name] reports that he has never smoked. He has never used smokeless tobacco. [name] reports that he does not drink alcohol.   Review of Systems CONSTITUTIONAL: No weight loss, fever, chills, weakness or fatigue.  HEENT: Eyes: No visual loss, blurred vision, double vision or yellow sclerae.No hearing loss, sneezing, congestion, runny nose or sore throat.  SKIN: No rash or itching.  CARDIOVASCULAR: no chest pain, no palpitations RESPIRATORY:  No shortness of breath, cough or sputum.  GASTROINTESTINAL: No anorexia, nausea, vomiting or diarrhea. No abdominal pain or blood.  GENITOURINARY: No burning on urination, no polyuria NEUROLOGICAL: No headache, dizziness, syncope, paralysis, ataxia, numbness or tingling in the extremities. No change in bowel or bladder control.  MUSCULOSKELETAL: No muscle, back pain, joint pain or stiffness.  LYMPHATICS: No enlarged nodes. No history of splenectomy.  PSYCHIATRIC: No history of depression or anxiety.  ENDOCRINOLOGIC: No reports of sweating, cold or heat intolerance. No polyuria or polydipsia.  Marland Kitchen.   Physical Examination p 69 bp 137/86 Wt 208 lbs BMI 27 Gen: resting comfortably, no acute distress HEENT: no scleral icterus, pupils equal round and reactive, no palptable cervical adenopathy,  CV: RRR, no m/r/g, no JVD, no carotid bruits Resp: Clear to auscultation bilaterally GI: abdomen is soft, non-tender, non-distended, normal bowel sounds, no hepatosplenomegaly MSK: extremities are warm, no edema.  Skin: warm, no rash Neuro:  no focal deficits Psych: appropriate affect    11/17/13 EKG NSR Assessment and Plan  1. HTN - at goal, continue current meds    F/u 1 year. Order placed for annual labs.   Antoine PocheJonathan F. Matilda Fleig, M.D., F.A.C.C.

## 2013-11-17 NOTE — Patient Instructions (Addendum)
Your physician wants you to follow-up in: 1 year You will receive a reminder letter in the mail two months in advance. If you don't receive a letter, please call our office to schedule the follow-up appointment.   Your physician recommends that you continue on your current medications as directed. Please refer to the Current Medication list given to you today.    Please get fasting blood work     Thank you for [facility] !

## 2013-11-21 LAB — CBC
HCT: 42.6 % (ref 39.0–52.0)
HEMOGLOBIN: 14.8 g/dL (ref 13.0–17.0)
MCH: 31 pg (ref 26.0–34.0)
MCHC: 34.7 g/dL (ref 30.0–36.0)
MCV: 89.3 fL (ref 78.0–100.0)
PLATELETS: 246 10*3/uL (ref 150–400)
RBC: 4.77 MIL/uL (ref 4.22–5.81)
RDW: 13.2 % (ref 11.5–15.5)
WBC: 5.4 10*3/uL (ref 4.0–10.5)

## 2013-11-22 LAB — COMPREHENSIVE METABOLIC PANEL
ALBUMIN: 4.2 g/dL (ref 3.5–5.2)
ALT: 13 U/L (ref 0–53)
AST: 17 U/L (ref 0–37)
Alkaline Phosphatase: 40 U/L (ref 39–117)
BUN: 20 mg/dL (ref 6–23)
CALCIUM: 9.1 mg/dL (ref 8.4–10.5)
CHLORIDE: 102 meq/L (ref 96–112)
CO2: 28 mEq/L (ref 19–32)
Creat: 1.01 mg/dL (ref 0.50–1.35)
GLUCOSE: 81 mg/dL (ref 70–99)
POTASSIUM: 4.4 meq/L (ref 3.5–5.3)
Sodium: 137 mEq/L (ref 135–145)
Total Bilirubin: 1.1 mg/dL (ref 0.2–1.2)
Total Protein: 6.6 g/dL (ref 6.0–8.3)

## 2013-11-22 LAB — PSA: PSA: 2.17 ng/mL (ref <=4.00)

## 2013-11-22 LAB — LIPID PANEL
Cholesterol: 153 mg/dL (ref 0–200)
HDL: 42 mg/dL (ref >39)
LDL CALC: 94 mg/dL (ref 0–99)
TRIGLYCERIDES: 83 mg/dL (ref <150)
Total CHOL/HDL Ratio: 3.6 Ratio
VLDL: 17 mg/dL (ref 0–40)

## 2013-11-22 LAB — TSH: TSH: 2.499 u[IU]/mL (ref 0.350–4.500)

## 2013-11-22 LAB — HEMOGLOBIN A1C
HEMOGLOBIN A1C: 5.1 % (ref <5.7)
Mean Plasma Glucose: 100 mg/dL (ref <117)

## 2013-12-24 ENCOUNTER — Other Ambulatory Visit: Payer: Self-pay | Admitting: Adult Health

## 2014-02-12 ENCOUNTER — Telehealth: Payer: Self-pay | Admitting: Adult Health

## 2014-02-12 MED ORDER — METOPROLOL TARTRATE 25 MG PO TABS: ORAL_TABLET | ORAL | Status: DC

## 2014-02-12 NOTE — Telephone Encounter (Signed)
Refill request complete 

## 2014-02-12 NOTE — Telephone Encounter (Signed)
Received fax refill request  Rx # [id number] Medication:  Metoprolol Tartrate 25 mg tablets Qty 30 Sig:  Take 1/2 tablet by mouth twice daily Physician:  Lyman BishopLawrence

## 2014-03-28 ENCOUNTER — Other Ambulatory Visit: Payer: Self-pay | Admitting: Adult Health

## 2014-03-29 ENCOUNTER — Other Ambulatory Visit: Payer: Self-pay | Admitting: Cardiology

## 2014-11-09 ENCOUNTER — Other Ambulatory Visit: Payer: Self-pay | Admitting: Orthopedic Surgery

## 2014-11-09 ENCOUNTER — Encounter (HOSPITAL_BASED_OUTPATIENT_CLINIC_OR_DEPARTMENT_OTHER)
Admission: RE | Admit: 2014-11-09 | Discharge: 2014-11-09 | Disposition: A | Discharge status: Home / Self Care | Payer: BC Managed Care – PPO | Source: Ambulatory Visit | Attending: Orthopedic Surgery | Admitting: Orthopedic Surgery

## 2014-11-09 ENCOUNTER — Encounter (HOSPITAL_BASED_OUTPATIENT_CLINIC_OR_DEPARTMENT_OTHER): Payer: Self-pay | Admitting: *Deleted

## 2014-11-09 ENCOUNTER — Other Ambulatory Visit: Payer: Self-pay

## 2014-11-09 DIAGNOSIS — S72001A Fracture of unspecified part of neck of right femur, initial encounter for closed fracture: Secondary | ICD-10-CM | POA: Diagnosis present

## 2014-11-09 DIAGNOSIS — Y999 Unspecified external cause status: Secondary | ICD-10-CM | POA: Diagnosis not present

## 2014-11-09 DIAGNOSIS — Y9302 Activity, running: Secondary | ICD-10-CM | POA: Diagnosis not present

## 2014-11-09 DIAGNOSIS — I10 Essential (primary) hypertension: Secondary | ICD-10-CM | POA: Diagnosis not present

## 2014-11-09 DIAGNOSIS — K219 Gastro-esophageal reflux disease without esophagitis: Secondary | ICD-10-CM | POA: Diagnosis not present

## 2014-11-09 DIAGNOSIS — Y929 Unspecified place or not applicable: Secondary | ICD-10-CM | POA: Diagnosis not present

## 2014-11-09 DIAGNOSIS — S72051A Unspecified fracture of head of right femur, initial encounter for closed fracture: Secondary | ICD-10-CM | POA: Diagnosis not present

## 2014-11-09 DIAGNOSIS — W208XXA Other cause of strike by thrown, projected or falling object, initial encounter: Secondary | ICD-10-CM | POA: Diagnosis not present

## 2014-11-09 LAB — BASIC METABOLIC PANEL
Anion gap: 10 (ref 5–15)
BUN: 21 mg/dL (ref 6–23)
CHLORIDE: 104 mmol/L (ref 96–112)
CO2: 25 mmol/L (ref 19–32)
CREATININE: 0.96 mg/dL (ref 0.50–1.35)
Calcium: 9.1 mg/dL (ref 8.4–10.5)
GFR calc Af Amer: >90 mL/min (ref >90)
GFR calc non Af Amer: 88 mL/min — ABNORMAL LOW (ref >90)
Glucose, Bld: 79 mg/dL (ref 70–99)
Potassium: 4.4 mmol/L (ref 3.5–5.1)
Sodium: 139 mmol/L (ref 135–145)

## 2014-11-09 NOTE — H&P (Signed)
Calvin Fernandez is an 59 y.o. male.   Chief Complaint: impacted right femoral neck fracture HPI: Patient presents with a chief complaint of right hip pain that began4/23/2016 when he was running and fell hard on concrete.  When he first stood up he almost passed out.  He borrowed a walker for 2 days and has been ambulating with a crutch in his right hand since then.  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  He is controlled his pain with Tylenol or ibuprofen but it persists and he is here today for orthopedic evaluation.  Initially he was seen by Dr. Althea Fernandez and plain x-rays showed an impacted right femoral neck fracture with essentially no angulation.  The impaction was 1-2 mm.  He denies any other injuries, denies any loss of consciousness.  Patient reports the pain is gotten a little bit better over the last couple of days.  Past Medical History  Diagnosis Date  . Chest pain 2011    mild aortic plaque on CT of abdomen; negative stress echocardiogram in 09/2011  . GERD (gastroesophageal reflux disease)   . Hypertension     multiple drug intolerances; noncompliance    No past surgical history on file.  Family History  Problem Relation Age of Onset  . Heart attack Father 1155   Social History:  reports that he has never smoked. He has never used smokeless tobacco. He reports that he does not drink alcohol or use illicit drugs.  Allergies:  Allergies  Allergen Reactions  . Tetanus Toxoid     No prescriptions prior to admission    No results found for this or any previous visit (from the past 48 hour(s)). No results found.  ROS :14 point review of systems form filled out by the patient was reviewed and was negative as it relates to the history of present illness except for: High blood pressure There were no vitals taken for this visit. Physical Exam: Well-nourished well-developed patient seated on the exam table in  no apparent distress, no shortness of breath.  Tender along the greater trochanter.  He does have irritability with internal/external rotation of his right hip to about 20.  Foot tap is actually negative.  RADIOGRAPHS:  X-rays were ordered, performed, and interpreted by me today included; AP pelvis and crosstable lateral of the right hip show a 1-2 mm impacted, slightly valgus fracture of the junction of the femoral neck and femoral head.  On the lateral view there is essentially no angulation.   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  The safest thing for this gentleman would be percutaneous pinning, which she is agreed to.  Because he is, performed a six-day stress test, it is reasonable to do this as a scheduled procedure, after the weekend on Monday, which is what he would like to do.  To do it today would require late afternoon.  Based on his last meal and would have to be as an urgent procedure, which is probably not required.  Patient has agreed to use a walker with minimal ambulation.  This weekend and understands he needs to avoid anything with a increased risk of falling.   Calvin Fernandez 11/09/2014, 11:31 AM

## 2014-11-09 NOTE — Progress Notes (Signed)
Pt came in [id number] done-hx htn

## 2014-11-12 ENCOUNTER — Encounter (HOSPITAL_BASED_OUTPATIENT_CLINIC_OR_DEPARTMENT_OTHER): Payer: Self-pay

## 2014-11-12 ENCOUNTER — Ambulatory Visit (HOSPITAL_BASED_OUTPATIENT_CLINIC_OR_DEPARTMENT_OTHER): Payer: BC Managed Care – PPO | Admitting: Anesthesiology

## 2014-11-12 ENCOUNTER — Encounter (HOSPITAL_BASED_OUTPATIENT_CLINIC_OR_DEPARTMENT_OTHER)
Admission: RE | Disposition: A | Discharge status: Home / Self Care | Payer: Self-pay | Source: Ambulatory Visit | Attending: Orthopedic Surgery

## 2014-11-12 ENCOUNTER — Ambulatory Visit (HOSPITAL_BASED_OUTPATIENT_CLINIC_OR_DEPARTMENT_OTHER)
Admission: RE | Admit: 2014-11-12 | Discharge: 2014-11-12 | Disposition: A | Discharge status: Home / Self Care | Payer: BC Managed Care – PPO | Source: Ambulatory Visit | Attending: Orthopedic Surgery | Admitting: Orthopedic Surgery

## 2014-11-12 ENCOUNTER — Ambulatory Visit (HOSPITAL_COMMUNITY): Payer: BC Managed Care – PPO

## 2014-11-12 DIAGNOSIS — W208XXA Other cause of strike by thrown, projected or falling object, initial encounter: Secondary | ICD-10-CM | POA: Insufficient documentation

## 2014-11-12 DIAGNOSIS — Y929 Unspecified place or not applicable: Secondary | ICD-10-CM | POA: Insufficient documentation

## 2014-11-12 DIAGNOSIS — Z419 Encounter for procedure for purposes other than remedying health state, unspecified: Secondary | ICD-10-CM

## 2014-11-12 DIAGNOSIS — Y9302 Activity, running: Secondary | ICD-10-CM | POA: Insufficient documentation

## 2014-11-12 DIAGNOSIS — S72051A Unspecified fracture of head of right femur, initial encounter for closed fracture: Secondary | ICD-10-CM | POA: Insufficient documentation

## 2014-11-12 DIAGNOSIS — K219 Gastro-esophageal reflux disease without esophagitis: Secondary | ICD-10-CM | POA: Insufficient documentation

## 2014-11-12 DIAGNOSIS — S72001A Fracture of unspecified part of neck of right femur, initial encounter for closed fracture: Secondary | ICD-10-CM | POA: Diagnosis not present

## 2014-11-12 DIAGNOSIS — I10 Essential (primary) hypertension: Secondary | ICD-10-CM | POA: Insufficient documentation

## 2014-11-12 DIAGNOSIS — Y999 Unspecified external cause status: Secondary | ICD-10-CM | POA: Insufficient documentation

## 2014-11-12 HISTORY — DX: Presence of spectacles and contact lenses: Z97.3

## 2014-11-12 HISTORY — PX: HIP PINNING,CANNULATED: SHX1758

## 2014-11-12 LAB — POCT HEMOGLOBIN-HEMACUE: Hemoglobin: 16.3 g/dL (ref 13.0–17.0)

## 2014-11-12 SURGERY — FIXATION, FEMUR, NECK, PERCUTANEOUS, USING SCREW
Anesthesia: General | Laterality: Right

## 2014-11-12 MED ORDER — PROPOFOL 10 MG/ML IV BOLUS
INTRAVENOUS | Status: DC | PRN
  Administered 2014-11-12: 240 mg via INTRAVENOUS

## 2014-11-12 MED ORDER — BUPIVACAINE-EPINEPHRINE (PF) 0.5% -1:200000 IJ SOLN
INTRAMUSCULAR | Status: AC
  Filled 2014-11-12: qty 30

## 2014-11-12 MED ORDER — LIDOCAINE HCL (CARDIAC) 20 MG/ML IV SOLN
INTRAVENOUS | Status: DC | PRN
  Administered 2014-11-12: 100 mg via INTRAVENOUS

## 2014-11-12 MED ORDER — MIDAZOLAM HCL 2 MG/2ML IJ SOLN: 1.0000 mg | INTRAMUSCULAR | Status: DC | PRN

## 2014-11-12 MED ORDER — ONDANSETRON HCL 4 MG/2ML IJ SOLN: 4.0000 mg | Freq: Four times a day (QID) | INTRAMUSCULAR | Status: DC | PRN

## 2014-11-12 MED ORDER — GLYCOPYRROLATE 0.2 MG/ML IJ SOLN: 0.2000 mg | Freq: Once | INTRAMUSCULAR | Status: DC | PRN

## 2014-11-12 MED ORDER — EPHEDRINE SULFATE 50 MG/ML IJ SOLN
INTRAMUSCULAR | Status: DC | PRN
  Administered 2014-11-12: 10 mg via INTRAVENOUS
  Administered 2014-11-12: 5 mg via INTRAVENOUS

## 2014-11-12 MED ORDER — ONDANSETRON HCL 4 MG PO TABS: 4.0000 mg | ORAL_TABLET | Freq: Four times a day (QID) | ORAL | Status: DC | PRN

## 2014-11-12 MED ORDER — OXYCODONE-ACETAMINOPHEN 5-325 MG PO TABS: 1.0000 | ORAL_TABLET | ORAL | Status: DC | PRN

## 2014-11-12 MED ORDER — MIDAZOLAM HCL 2 MG/2ML IJ SOLN
INTRAMUSCULAR | Status: AC
  Filled 2014-11-12: qty 2

## 2014-11-12 MED ORDER — DEXTROSE-NACL 5-0.45 % IV SOLN: INTRAVENOUS | Status: DC

## 2014-11-12 MED ORDER — FENTANYL CITRATE (PF) 100 MCG/2ML IJ SOLN
INTRAMUSCULAR | Status: AC
  Filled 2014-11-12: qty 6

## 2014-11-12 MED ORDER — SUCCINYLCHOLINE CHLORIDE 20 MG/ML IJ SOLN
INTRAMUSCULAR | Status: DC | PRN
  Administered 2014-11-12: 120 mg via INTRAVENOUS

## 2014-11-12 MED ORDER — METOCLOPRAMIDE HCL 5 MG/ML IJ SOLN: 5.0000 mg | Freq: Three times a day (TID) | INTRAMUSCULAR | Status: DC | PRN

## 2014-11-12 MED ORDER — METOCLOPRAMIDE HCL 5 MG PO TABS: 5.0000 mg | ORAL_TABLET | Freq: Three times a day (TID) | ORAL | Status: DC | PRN

## 2014-11-12 MED ORDER — LIDOCAINE HCL 4 % MT SOLN
OROMUCOSAL | Status: DC | PRN
  Administered 2014-11-12: 3 mL via TOPICAL

## 2014-11-12 MED ORDER — CHLORHEXIDINE GLUCONATE 4 % EX LIQD: 60.0000 mL | Freq: Once | CUTANEOUS | Status: DC

## 2014-11-12 MED ORDER — FENTANYL CITRATE (PF) 100 MCG/2ML IJ SOLN: 50.0000 ug | INTRAMUSCULAR | Status: DC | PRN

## 2014-11-12 MED ORDER — DEXAMETHASONE SODIUM PHOSPHATE 4 MG/ML IJ SOLN
INTRAMUSCULAR | Status: DC | PRN
  Administered 2014-11-12: 10 mg via INTRAVENOUS

## 2014-11-12 MED ORDER — ONDANSETRON HCL 4 MG/2ML IJ SOLN
INTRAMUSCULAR | Status: DC | PRN
  Administered 2014-11-12: 4 mg via INTRAVENOUS

## 2014-11-12 MED ORDER — CEFAZOLIN SODIUM-DEXTROSE 2-3 GM-% IV SOLR
2.0000 g | INTRAVENOUS | Status: AC
  Administered 2014-11-12: 2 g via INTRAVENOUS

## 2014-11-12 MED ORDER — LACTATED RINGERS IV SOLN
INTRAVENOUS | Status: DC
  Administered 2014-11-12 (×2): via INTRAVENOUS

## 2014-11-12 MED ORDER — CEFAZOLIN SODIUM-DEXTROSE 2-3 GM-% IV SOLR
INTRAVENOUS | Status: AC
  Filled 2014-11-12: qty 50

## 2014-11-12 MED ORDER — FENTANYL CITRATE (PF) 100 MCG/2ML IJ SOLN
INTRAMUSCULAR | Status: DC | PRN
  Administered 2014-11-12: 50 ug via INTRAVENOUS
  Administered 2014-11-12: 100 ug via INTRAVENOUS

## 2014-11-12 SURGICAL SUPPLY — 49 items
6.5 mm x 100mm cannulated screw ×4 IMPLANT
BANDAGE ELASTIC 6 VELCRO ST LF (GAUZE/BANDAGES/DRESSINGS) IMPLANT
BANDAGE ESMARK 6X9 LF (GAUZE/BANDAGES/DRESSINGS) IMPLANT
BLADE CLIPPER SURG (BLADE) ×2 IMPLANT
BLADE SURG 15 STRL LF DISP TIS (BLADE) ×1 IMPLANT
BLADE SURG 15 STRL SS (BLADE) ×3
BNDG CMPR 9X6 STRL LF SNTH (GAUZE/BANDAGES/DRESSINGS) ×1
BNDG ESMARK 6X9 LF (GAUZE/BANDAGES/DRESSINGS) ×3
DECANTER SPIKE VIAL GLASS SM (MISCELLANEOUS) IMPLANT
DRAPE C-ARM 42X72 X-RAY (DRAPES) ×2 IMPLANT
DRAPE C-ARMOR (DRAPES) ×2 IMPLANT
DRAPE EXTREMITY T 121X128X90 (DRAPE) ×3 IMPLANT
DRAPE LAPAROTOMY T 98X78 PEDS (DRAPES) ×2 IMPLANT
DRAPE U-SHAPE 47X51 STRL (DRAPES) ×3 IMPLANT
DRSG MEPILEX BORDER 4X8 (GAUZE/BANDAGES/DRESSINGS) ×2 IMPLANT
DURAPREP 26ML APPLICATOR (WOUND CARE) ×3 IMPLANT
ELECT REM PT RETURN 9FT ADLT (ELECTROSURGICAL) ×3
ELECTRODE REM PT RTRN 9FT ADLT (ELECTROSURGICAL) ×1 IMPLANT
GAUZE SPONGE 4X4 12PLY STRL (GAUZE/BANDAGES/DRESSINGS) ×3 IMPLANT
GAUZE XEROFORM 1X8 LF (GAUZE/BANDAGES/DRESSINGS) ×3 IMPLANT
GLOVE BIO SURGEON STRL SZ 6.5 (GLOVE) ×1 IMPLANT
GLOVE BIO SURGEON STRL SZ7.5 (GLOVE) ×6 IMPLANT
GLOVE BIO SURGEON STRL SZ8.5 (GLOVE) ×3 IMPLANT
GLOVE BIO SURGEONS STRL SZ 6.5 (GLOVE) ×1
GLOVE BIOGEL M STRL SZ7.5 (GLOVE) ×2 IMPLANT
GLOVE BIOGEL PI IND STRL 8 (GLOVE) ×1 IMPLANT
GLOVE BIOGEL PI IND STRL 9 (GLOVE) ×1 IMPLANT
GLOVE BIOGEL PI INDICATOR 8 (GLOVE) ×4
GLOVE BIOGEL PI INDICATOR 9 (GLOVE) ×2
GOWN STRL REUS W/ TWL LRG LVL3 (GOWN DISPOSABLE) ×1 IMPLANT
GOWN STRL REUS W/TWL LRG LVL3 (GOWN DISPOSABLE) ×3
GOWN STRL REUS W/TWL XL LVL3 (GOWN DISPOSABLE) ×5 IMPLANT
GUIDE PIN ×4 IMPLANT
GUIDEWIRE THREADED 2.8 (WIRE) ×2 IMPLANT
NS IRRIG 1000ML POUR BTL (IV SOLUTION) ×1 IMPLANT
PACK ARTHROSCOPY DSU (CUSTOM PROCEDURE TRAY) ×3 IMPLANT
PADDING CAST ABS 4INX4YD NS (CAST SUPPLIES) ×2
PADDING CAST ABS COTTON 4X4 ST (CAST SUPPLIES) ×1 IMPLANT
PENCIL BUTTON HOLSTER BLD 10FT (ELECTRODE) ×3 IMPLANT
SCREW CANN RVR CUT FLUT 90X16X (Screw) IMPLANT
SCREW CANNULATED 6.5X90 (Screw) ×3 IMPLANT
SLEEVE SCD COMPRESS KNEE MED (MISCELLANEOUS) IMPLANT
SUT ETHILON 3 0 PS 1 (SUTURE) ×3 IMPLANT
SUT VIC AB 2-0 CT1 27 (SUTURE) ×3
SUT VIC AB 2-0 CT1 TAPERPNT 27 (SUTURE) ×1 IMPLANT
SUT VIC AB 3-0 FS2 27 (SUTURE) IMPLANT
SYR BULB 3OZ (MISCELLANEOUS) ×3 IMPLANT
TOWEL OR 17X24 6PK STRL BLUE (TOWEL DISPOSABLE) ×6 IMPLANT
UNDERPAD 30X30 (UNDERPADS AND DIAPERS) ×3 IMPLANT

## 2014-11-12 NOTE — Discharge Instructions (Addendum)
Hip Fracture A hip fracture is a fracture of the upper part of your thigh bone (femur).  CAUSES A hip fracture is caused by a direct blow to the side of your hip. This is usually the result of a fall but can occur in other circumstances, such as an automobile accident. RISK FACTORS There is an increased risk of hip fractures in people with:  An unsteady walking pattern (gait) and those with conditions that contribute to poor balance, such as Parkinson's disease or dementia.  Osteopenia and osteoporosis.  Cancer that spreads to the leg bones.  Certain metabolic diseases. SYMPTOMS  Symptoms of hip fracture include:  Pain over the injured hip.  Inability to put weight on the leg in which the fracture occurred (although, some patients are able to walk after a hip fracture).  Toes and foot of the affected leg point outward when you lie down. DIAGNOSIS A physical exam can determine if a hip fracture is likely to have occurred. X-ray exams are needed to confirm the fracture and to look for other injuries. The X-ray exam can help to determine the type of hip fracture. Rarely, the fracture is not visible on an X-ray image and a CT scan or MRI will have to be done. TREATMENT  The treatment for a fracture is usually surgery. This means using a screw, nail, or rod to hold the bones in place.  HOME CARE INSTRUCTIONS Take all medicines as directed by your health care provider. SEEK MEDICAL CARE IF: Pain continues, even after taking pain medicine. MAKE SURE YOU:  Understand these instructions.   Will watch your condition.  Will get help right away if you are not doing well or get worse. Document Released: 06/29/2005 Document Revised: 07/04/2013 Document Reviewed: 02/08/2013 [facility] Patient Information 2015 [employer], [place]. This information is not intended to replace advice given to you by your health care provider. Make sure you discuss any questions you have with your health care  provider.  Post Anesthesia Home Care Instructions  Activity: Get plenty of rest for the remainder of the day. A responsible adult should stay with you for 24 hours following the procedure.  For the next 24 hours, DO NOT: -Drive a car -Advertising copywriterperate machinery -Drink alcoholic beverages -Take any medication unless instructed by your physician -Make any legal decisions or sign important papers.  Meals: Start with liquid foods such as gelatin or soup. Progress to regular foods as tolerated. Avoid greasy, spicy, heavy foods. If nausea and/or vomiting occur, drink only clear liquids until the nausea and/or vomiting subsides. Call your physician if vomiting continues.  Special Instructions/Symptoms: Your throat may feel dry or sore from the anesthesia or the breathing tube placed in your throat during surgery. If this causes discomfort, gargle with warm salt water. The discomfort should disappear within 24 hours.  If you had a scopolamine patch placed behind your ear for the management of post- operative nausea and/or vomiting:  1. The medication in the patch is effective for 72 hours, after which it should be removed.  Wrap patch in a tissue and discard in the trash. Wash hands thoroughly with soap and water. 2. You may remove the patch earlier than 72 hours if you experience unpleasant side effects which may include dry mouth, dizziness or visual disturbances. 3. Avoid touching the patch. Wash your hands with soap and water after contact with the patch.   Call your surgeon if you experience:   1.  Fever over 101.0. 2.  Inability to  urinate. 3.  Nausea and/or vomiting. 4.  Extreme swelling or bruising at the surgical site. 5.  Continued bleeding from the incision. 6.  Increased pain, redness or drainage from the incision. 7.  Problems related to your pain medication. 8. Any change in color, movement and/or sensation 9. Any problems and/or concerns

## 2014-11-12 NOTE — Op Note (Signed)
PREOPERATIVE DIAGNOSIS: Right femoral neck fracture impacted slightly valgus POSTOPERATIVE DIAGNOSIS: Same  PROCEDURE: Percutaneous fixation of impacted valgus right femoral neck fracture using 3 6.5 mm Zimmer cannulated screws SURGEON: Oluwasemilore Pascuzzi J  ASSISTANT: Eric K. Gaylene BrooksPhillips PA-C (present throughout entire procedure and necessary for timely completion of the procedure) ANESTHESIA: General  BLOOD LOSS: 10 cc  FLUID REPLACEMENT: 1000 cc crystalloid  COMPLICATIONS: none   INDICATIONS FOR PROCEDURE: Patient fell 6 days ago on concrete injuring his right hip. 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 X-rays show an impacted, slightly valgus right femoral neck fracture. He does he had proven stability over 5 days he elected to have percutaneous fixation accomplished at the day surgery center 3 days later.. The risks, benefits, and alternatives were discussed at length including but not limited to the risks of infection, bleeding, nerve injury, stiffness, blood clots, the need for revision surgery, cardiopulmonary complications, among others, and they were willing to proceed. Benefits have been discussed. Questions answered.   PROCEDURE IN DETAIL: The patient was identified by armband,  received preoperative IV antibiotics in the holding area, taken to the operating room , appropriate anesthetic monitors were attached and general endotracheal anesthesia induced. Pt. was then transferred to a radiolucent flat table, rolled into the L lateral decubitus position and fixed there with a Stulberg Mark 2 pelvic clamp. Prior to prep and drape C-arm imaging was used to confirm a minimally displaced valgus impacted right femoral neck fracture. The right lower extremity was then prepped and draped in usual  sterile fashion in the iliac crest to the ankle. A timeout procedure was performed. Under C-arm image control a line was drawn coaxially with the femoral neck on the lateral view, a stab wound was made at the level of the flare of the greater trochanter allowing passage of a guide pin through the lateral cortex of the femoral neck and into the femoral head on AP and lateral views on the C-arm. Satisfied with the pin position we then measured for a 100 mm Zimmer stainless steel 6.5 mm cannulated screw which was inserted without difficulty under C-arm image control. This screw was placed in an inferior posterior position. In a similar fashion we then placed a straight superior screw as well as one that was anterior in relation to the first 2 screws. All screws went through the fracture site with the partial threads and the screws tightened down nicely obtaining good compression. Under C-arm image control the hip was taken through range of motion and the screws were inside of the bone on all images. The wound was then irrigated out normal saline solution, we did use 20 mL of half percent Marcaine and epinephrine solution as a local anesthetic at the end of the case and the skin and subcutaneous tissue. The subcutaneous and subcuticular closure was accomplished with running 3-0 Vicryl suture and a small Mepilex dressing was applied.   Gean BirchwoodOWAN,Suzzette Gasparro J  02/18/2012, 7:29 PM

## 2014-11-12 NOTE — Transfer of Care (Signed)
Immediate Anesthesia Transfer of Care Note  Patient: Calvin Fernandez  Procedure(s) Performed: Procedure(s): PERCUTANEOUS PINNING RIGHT HIP  (Right)  Patient Location: PACU  Anesthesia Type:General  Level of Consciousness: sedated  Airway & Oxygen Therapy: Patient Spontanous Breathing and Patient connected to face mask oxygen  Post-op Assessment: Report given to RN and Post -op Vital signs reviewed and stable  Post vital signs: Reviewed and stable  Last Vitals:  Filed Vitals:   11/12/14 1136  BP: 133/76  Pulse: 61  Temp: 36.6 C  Resp: 20    Complications: No apparent anesthesia complications

## 2014-11-12 NOTE — Anesthesia Preprocedure Evaluation (Signed)
Anesthesia Evaluation  Patient identified by MRN, date of birth, ID band Patient awake    Reviewed: Allergy & Precautions  Airway Mallampati: II  TM Distance: >3 FB Neck ROM: Full    Dental   Pulmonary neg pulmonary ROS,  breath sounds clear to auscultation        Cardiovascular hypertension, Rhythm:Regular Rate:Normal     Neuro/Psych    GI/Hepatic Neg liver ROS, GERD-  ,  Endo/Other    Renal/GU negative Renal ROS     Musculoskeletal   Abdominal   Peds  Hematology   Anesthesia Other Findings   Reproductive/Obstetrics                             Anesthesia Physical Anesthesia Plan  ASA: II  Anesthesia Plan: General   Post-op Pain Management:    Induction: Intravenous  Airway Management Planned: Oral ETT  Additional Equipment:   Intra-op Plan:   Post-operative Plan: Extubation in OR  Informed Consent: I have reviewed the patients History and Physical, chart, labs and discussed the procedure including the risks, benefits and alternatives for the proposed anesthesia with the patient or authorized representative who has indicated his/her understanding and acceptance.   Dental advisory given  Plan Discussed with: CRNA and Anesthesiologist  Anesthesia Plan Comments:         Anesthesia Quick Evaluation

## 2014-11-12 NOTE — Anesthesia Postprocedure Evaluation (Signed)
  Anesthesia Post-op Note  Patient: Calvin MckusickKenneth P Fernandez  Procedure(s) Performed: Procedure(s): PERCUTANEOUS PINNING RIGHT HIP  (Right)  Patient Location: PACU  Anesthesia Type:General  Level of Consciousness: awake  Airway and Oxygen Therapy: Patient Spontanous Breathing  Post-op Pain: mild  Post-op Assessment: Post-op Vital signs reviewed  Post-op Vital Signs: Reviewed  Last Vitals:  Filed Vitals:   11/12/14 1515  BP: 120/76  Pulse: 76  Temp:   Resp: 14    Complications: No apparent anesthesia complications

## 2014-11-12 NOTE — Anesthesia Procedure Notes (Signed)
Procedure Name: Intubation Date/Time: 11/12/2014 1:10 PM Performed by: Burna CashONRAD, Keion Neels C Pre-anesthesia Checklist: Patient identified, Emergency Drugs available, Suction available and Patient being monitored Patient Re-evaluated:Patient Re-evaluated prior to inductionOxygen Delivery Method: Circle System Utilized Preoxygenation: Pre-oxygenation with 100% oxygen Intubation Type: IV induction Ventilation: Mask ventilation without difficulty Laryngoscope Size: Mac and 3 Grade View: Grade I Tube type: Oral Tube size: 8.0 mm Number of attempts: 1 Airway Equipment and Method: Stylet and Oral airway Placement Confirmation: ETT inserted through vocal cords under direct vision,  positive ETCO2 and breath sounds checked- equal and bilateral Secured at: 22 cm Tube secured with: Tape Dental Injury: Teeth and Oropharynx as per pre-operative assessment

## 2014-11-15 ENCOUNTER — Encounter (HOSPITAL_BASED_OUTPATIENT_CLINIC_OR_DEPARTMENT_OTHER): Payer: Self-pay | Admitting: Orthopedic Surgery

## 2014-11-19 ENCOUNTER — Encounter (HOSPITAL_BASED_OUTPATIENT_CLINIC_OR_DEPARTMENT_OTHER): Payer: Self-pay | Admitting: Orthopedic Surgery

## 2014-11-20 ENCOUNTER — Ambulatory Visit (INDEPENDENT_AMBULATORY_CARE_PROVIDER_SITE_OTHER): Payer: BC Managed Care – PPO | Admitting: Cardiology

## 2014-11-20 ENCOUNTER — Encounter: Payer: Self-pay | Admitting: Cardiology

## 2014-11-20 VITALS — BP 138/82 | HR 88 | Ht 74.0 in | Wt 206.0 lb

## 2014-11-20 DIAGNOSIS — I10 Essential (primary) hypertension: Secondary | ICD-10-CM

## 2014-11-20 MED ORDER — HYDROCHLOROTHIAZIDE 12.5 MG PO CAPS: 12.5000 mg | ORAL_CAPSULE | Freq: Every day | ORAL | Status: DC

## 2014-11-20 NOTE — Progress Notes (Signed)
Clinical Summary Calvin Fernandez is a 59 y.o.male seen today for follow up of the following medical problems.   1. HTN - checks occasionally, typically 120s/80s - compliant with meds  2. Hip surgery - recent fall and hip fracture, had surgery 11/2014   Past Medical History  Diagnosis Date  . Chest pain 2011    mild aortic plaque on CT of abdomen; negative stress echocardiogram in 09/2011  . GERD (gastroesophageal reflux disease)   . Hypertension     multiple drug intolerances; noncompliance  . Wears glasses      Allergies  Allergen Reactions  . Tetanus Toxoid Rash     Current Outpatient Prescriptions  Medication Sig Dispense Refill  . aspirin 81 MG tablet Take 81 mg by mouth daily.    . hydrochlorothiazide (HYDRODIURIL) 25 MG tablet TAKE 1 TABLET BY MOUTH DAILY 30 tablet 6  . metoprolol tartrate (LOPRESSOR) 25 MG tablet TAKE 1/2 TABLET BY MOUTH TWICE DAILY (Patient taking differently: 25 mg. TAKE 1/2 TABLET BY MOUTH TWICE DAILY) 90 tablet 3  . oxyCODONE-acetaminophen (ROXICET) 5-325 MG per tablet Take 1 tablet by mouth every 4 (four) hours as needed. 60 tablet 0  . [DISCONTINUED] metoprolol succinate (TOPROL-XL) 25 MG 24 hr tablet Take 25 mg by mouth 3 (three) times daily.      No current facility-administered medications for this visit.     Past Surgical History  Procedure Laterality Date  . Extensor tendon of forearm / wrist repair  1990    left  . Hip pinning,cannulated Right 11/12/2014    Procedure: PERCUTANEOUS PINNING RIGHT HIP ;  Surgeon: Gean BirchwoodFrank Rowan, MD;  Location: [facility];  Service: Orthopedics;  Laterality: Right;     Allergies  Allergen Reactions  . Tetanus Toxoid Rash      Family History  Problem Relation Age of Onset  . Heart attack Father 5255     Social History Calvin Fernandez reports that he has never smoked. He has never used smokeless tobacco. Calvin Fernandez reports that he does not drink alcohol.   Review of  Systems CONSTITUTIONAL: No weight loss, fever, chills, weakness or fatigue.  HEENT: Eyes: No visual loss, blurred vision, double vision or yellow sclerae.No hearing loss, sneezing, congestion, runny nose or sore throat.  SKIN: No rash or itching.  CARDIOVASCULAR: per HPI RESPIRATORY: No shortness of breath, cough or sputum.  GASTROINTESTINAL: No anorexia, nausea, vomiting or diarrhea. No abdominal pain or blood.  GENITOURINARY: No burning on urination, no polyuria NEUROLOGICAL: No headache, dizziness, syncope, paralysis, ataxia, numbness or tingling in the extremities. No change in bowel or bladder control.  MUSCULOSKELETAL: No muscle, back pain, joint pain or stiffness.  LYMPHATICS: No enlarged nodes. No history of splenectomy.  PSYCHIATRIC: No history of depression or anxiety.  ENDOCRINOLOGIC: No reports of sweating, cold or heat intolerance. No polyuria or polydipsia.  Marland Kitchen.   Physical Examination p 88 bp 138/82 Wt 206 lbs BMI 26 Gen: resting comfortably, no acute distress HEENT: no scleral icterus, pupils equal round and reactive, no palptable cervical adenopathy,  CV: RRR, no m/r/g, no JVD, no carotid bruits Resp: Clear to auscultation bilaterally GI: abdomen is soft, non-tender, non-distended, normal bowel sounds, no hepatosplenomegaly MSK: extremities are warm, no edema.  Skin: warm, no rash Neuro:  no focal deficits Psych: appropriate affect   Diagnostic Studies 09/2011 Stress Echo Study Conclusions  - Stress ECG conclusions: The stress ECG was negative for ischemia. - Staged echo: There was no echocardiographic  evidence for stress-induced ischemia. Bruce protocol. Stress echocardiography. Height: Height: 188cm. Height: 74in. Weight: Weight: 104.1kg. Weight: 229lb. Body mass index: BMI: 29.5kg/m^2. Body surface area:  BSA: 2.3427m^2. Blood pressure:   162/90. Patient status: Outpatient.     Assessment and Plan  1. HTN - at goal, continue current  meds - reports frequent urination on HCTZ, will decrease to 12.5mg  daily.  - he will keep bp log and call with results in 1 week, and also call to let us know how his frequent urination is doing. May need to come off diuretic, possible prostate eval.     Check annual labs. F/u 1 year      Antoine PocheJonathan F. Brynn Reznik, M.D.

## 2014-11-20 NOTE — Patient Instructions (Signed)
Your physician wants you to follow-up in: 1 year with Dr Lurena JoinerBranch You will receive a reminder letter in the mail two months in advance. If you don't receive a letter, please call our office to schedule the follow-up appointment.    DECREASE HCTZ to 12.5 mg daily  Please get FASTING lab work  Keep BP log for 2 weeks and then drop log off for Dr.Branch to review   Thank you for choosing [facility] !

## 2014-11-21 ENCOUNTER — Other Ambulatory Visit: Payer: Self-pay | Admitting: Cardiology

## 2014-11-22 LAB — HGB A1C W/O EAG: Hgb A1c MFr Bld: 5.2 % (ref 4.8–5.6)

## 2014-11-22 LAB — PSA: PROSTATE SPECIFIC AG, SERUM: 3 ng/mL (ref 0.0–4.0)

## 2014-11-22 LAB — CBC WITH DIFFERENTIAL/PLATELET
BASOS ABS: 0 10*3/uL (ref 0.0–0.2)
Basos: 1 %
EOS (ABSOLUTE): 0.2 10*3/uL (ref 0.0–0.4)
Eos: 3 %
HEMATOCRIT: 44.6 % (ref 37.5–51.0)
Hemoglobin: 15.4 g/dL (ref 12.6–17.7)
Immature Grans (Abs): 0 10*3/uL (ref 0.0–0.1)
Immature Granulocytes: 0 %
LYMPHS ABS: 1.9 10*3/uL (ref 0.7–3.1)
LYMPHS: 27 %
MCH: 30.7 pg (ref 26.6–33.0)
MCHC: 34.5 g/dL (ref 31.5–35.7)
MCV: 89 fL (ref 79–97)
MONOCYTES: 7 %
MONOS ABS: 0.5 10*3/uL (ref 0.1–0.9)
NEUTROS ABS: 4.3 10*3/uL (ref 1.4–7.0)
Neutrophils: 62 %
Platelets: 295 10*3/uL (ref 150–379)
RBC: 5.01 x10E6/uL (ref 4.14–5.80)
RDW: 13.1 % (ref 12.3–15.4)
WBC: 6.9 10*3/uL (ref 3.4–10.8)

## 2014-11-22 LAB — HEPATIC FUNCTION PANEL
ALT: 16 IU/L (ref 0–44)
AST: 14 IU/L (ref 0–40)
Albumin: 4.2 g/dL (ref 3.6–4.8)
Alkaline Phosphatase: 74 IU/L (ref 39–117)
Bilirubin Total: 1.2 mg/dL (ref 0.0–1.2)
Bilirubin, Direct: 0.28 mg/dL (ref 0.00–0.40)
Total Protein: 6.7 g/dL (ref 6.0–8.5)

## 2014-11-22 LAB — TSH: TSH: 2.42 u[IU]/mL (ref 0.450–4.500)

## 2014-11-22 LAB — LIPID PANEL W/O CHOL/HDL RATIO
Cholesterol, Total: 161 mg/dL (ref 100–199)
HDL: 40 mg/dL (ref >39)
LDL Calculated: 104 mg/dL — ABNORMAL HIGH (ref 0–99)
TRIGLYCERIDES: 85 mg/dL (ref 0–149)
VLDL Cholesterol Cal: 17 mg/dL (ref 5–40)

## 2014-11-23 ENCOUNTER — Telehealth: Payer: Self-pay | Admitting: *Deleted

## 2014-11-23 NOTE — Telephone Encounter (Signed)
Pt made aware, forwarded to pcp 

## 2014-11-23 NOTE — Telephone Encounter (Signed)
-----   Message from Antoine PocheJonathan F Branch, MD sent at 11/23/2014 10:25 AM EDT ----- Labs look good  Dominga FerryJ Branch MD

## 2014-12-06 ENCOUNTER — Telehealth: Payer: Self-pay | Admitting: *Deleted

## 2014-12-06 NOTE — Telephone Encounter (Signed)
BP Log placed on Dr. Wyline MoodBranch Desk for review

## 2014-12-10 ENCOUNTER — Other Ambulatory Visit: Payer: Self-pay | Admitting: Cardiology

## 2015-01-08 ENCOUNTER — Telehealth: Payer: Self-pay | Admitting: *Deleted

## 2015-01-08 NOTE — Telephone Encounter (Signed)
Pt says this is much better "has made the biggest difference with BP under control and a blessing". Forwarded to Dr. Wyline MoodBranch as Lorain ChildesFYI

## 2015-01-08 NOTE — Telephone Encounter (Signed)
-----   Message from Antoine PocheJonathan F Branch, MD sent at 01/07/2015  3:34 PM EDT ----- BP log overall looks good. Last visit we decreased the dose of his HCTZ due to frequent urination, how is that doing?  Dominga FerryJ  Branch MD

## 2015-03-18 ENCOUNTER — Other Ambulatory Visit: Payer: Self-pay | Admitting: Cardiology

## 2015-05-08 IMAGING — RF DG HIP (WITH PELVIS) OPERATIVE*R*
1 series · 5 of 5 positions shown · non-contrast
Comparison: No priors.

CLINICAL DATA: 59-year-old male status post percutaneous pinning of
the right hip.

EXAM:
OPERATIVE HIP 5 VIEWS
TECHNIQUE: Fluoroscopic spot image(s) were submitted for interpretation
post-operatively.
FLUOROSCOPY TIME:  If the device does not provide the exposure
index:
Fluoroscopy Time:  1 minutes and 56 seconds
Number of Acquired Images:  5

[Series 1: run · 5 of 5 slices shown]
[im 1/5]
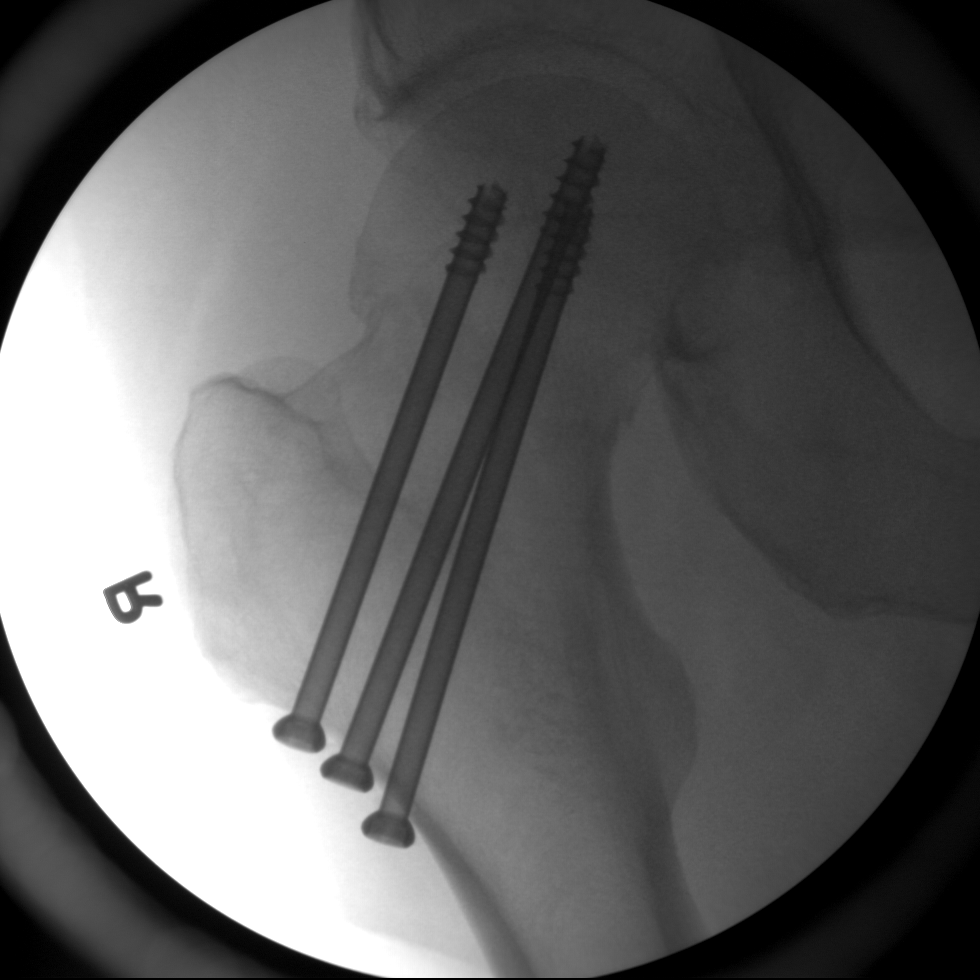
[im 2/5]
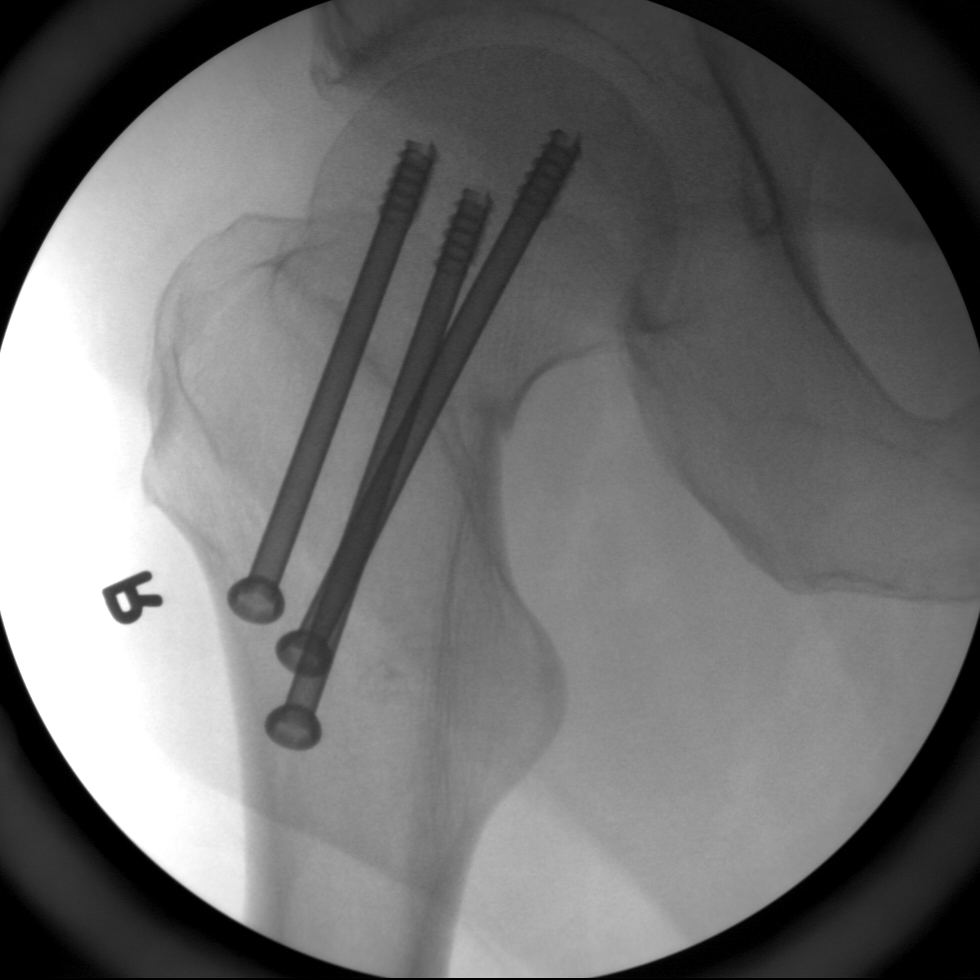
[im 3/5]
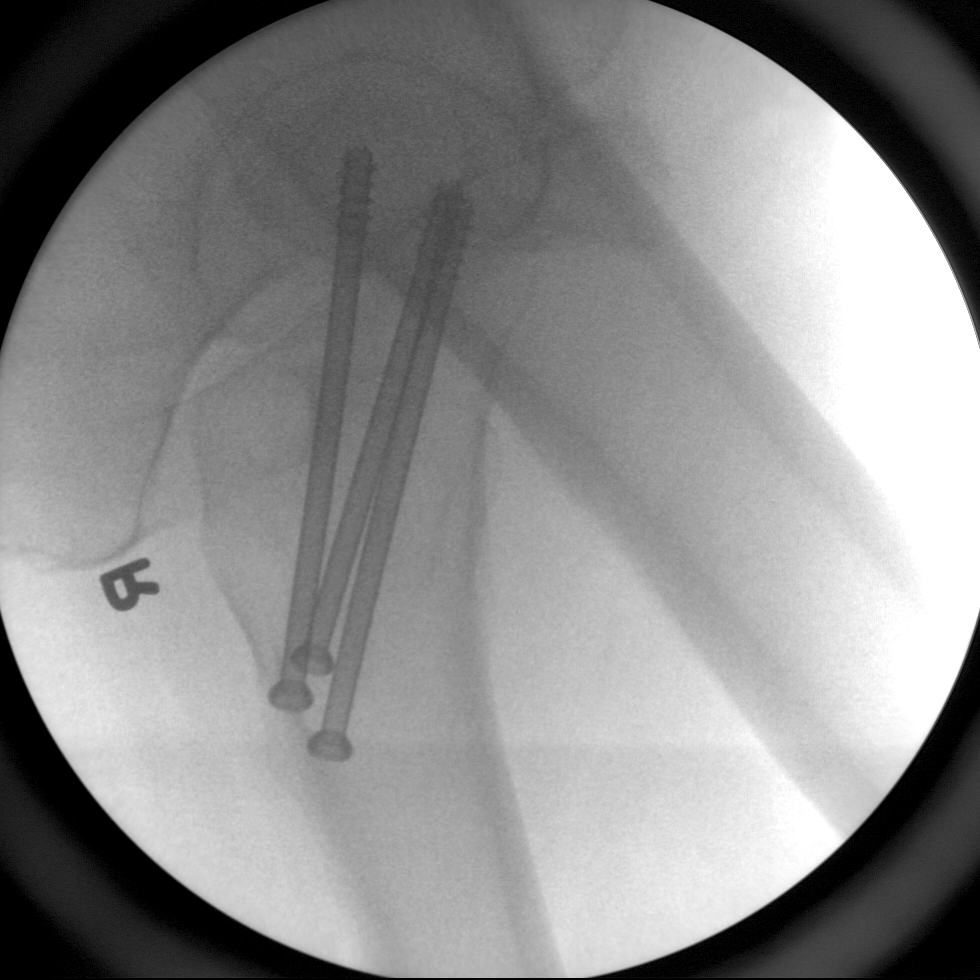
[im 4/5]
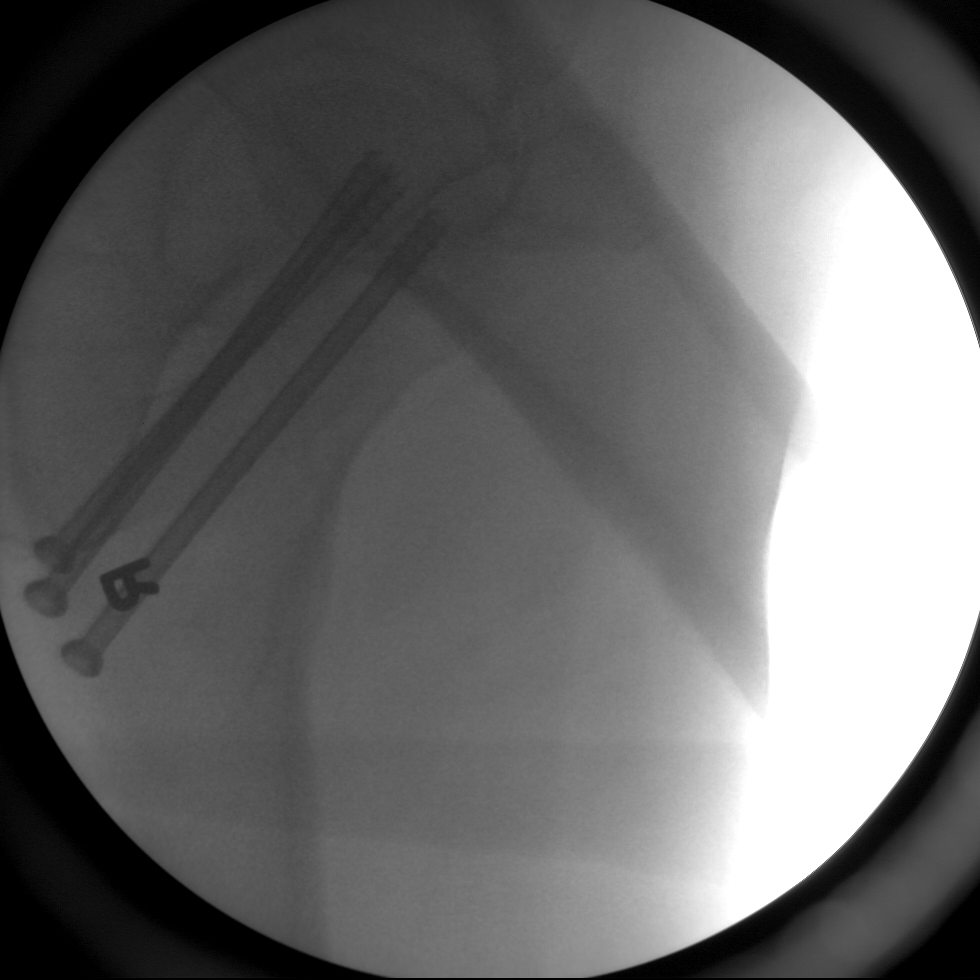
[im 5/5]
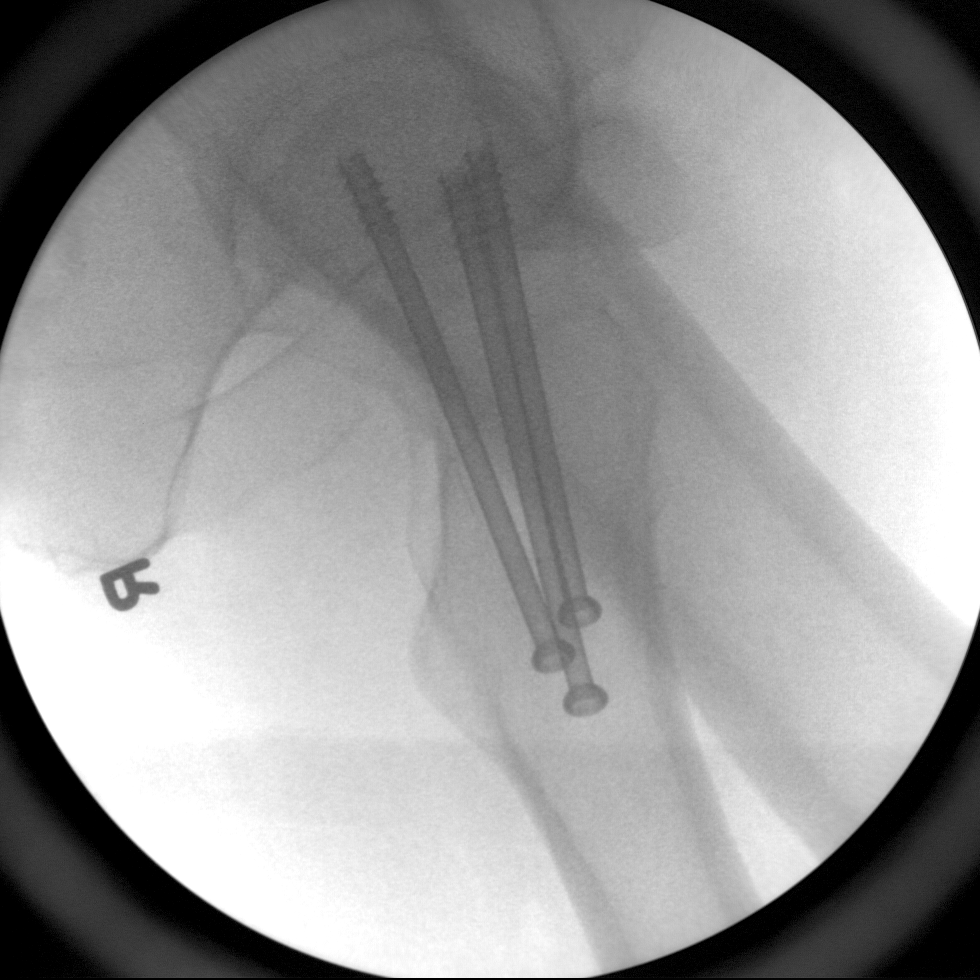

[5 of 5 positions shown; findings below may reference images not displayed]

FINDINGS: Five intraoperative fluoroscopic spot views of the right hip
demonstrate 3 cannulated fixation screws extending through the
femoral neck into the femoral head. On 2 of the images provided
there appears to be a subtle nondisplaced transcervical femoral neck
fracture. Femoral head is properly located.
IMPRESSION: 1. Intraoperative documentation of ORIF in the right femoral neck,
as above.

## 2015-05-10 ENCOUNTER — Telehealth: Payer: Self-pay | Admitting: Cardiology

## 2015-05-10 MED ORDER — HYDROCHLOROTHIAZIDE 25 MG PO TABS: 12.5000 mg | ORAL_TABLET | Freq: Every day | ORAL | Status: DC

## 2015-05-10 NOTE — Telephone Encounter (Signed)
Patient would like to speak with nurse regarding getting one of his meds switched from tablet to capsule / tg

## 2015-05-10 NOTE — Telephone Encounter (Signed)
rx changed from capsules to tablets

## 2015-09-24 ENCOUNTER — Other Ambulatory Visit: Payer: Self-pay | Admitting: Cardiology

## 2015-11-25 ENCOUNTER — Ambulatory Visit (INDEPENDENT_AMBULATORY_CARE_PROVIDER_SITE_OTHER): Payer: BC Managed Care – PPO | Admitting: Cardiology

## 2015-11-25 ENCOUNTER — Encounter: Payer: Self-pay | Admitting: Cardiology

## 2015-11-25 ENCOUNTER — Encounter: Payer: Self-pay | Admitting: *Deleted

## 2015-11-25 VITALS — BP 138/92 | HR 59 | Ht 74.0 in | Wt 212.0 lb

## 2015-11-25 DIAGNOSIS — I10 Essential (primary) hypertension: Secondary | ICD-10-CM

## 2015-11-25 DIAGNOSIS — R7309 Other abnormal glucose: Secondary | ICD-10-CM

## 2015-11-25 DIAGNOSIS — R0789 Other chest pain: Secondary | ICD-10-CM

## 2015-11-25 DIAGNOSIS — E785 Hyperlipidemia, unspecified: Secondary | ICD-10-CM

## 2015-11-25 DIAGNOSIS — Z125 Encounter for screening for malignant neoplasm of prostate: Secondary | ICD-10-CM

## 2015-11-25 DIAGNOSIS — Z131 Encounter for screening for diabetes mellitus: Secondary | ICD-10-CM

## 2015-11-25 MED ORDER — AMLODIPINE BESYLATE 10 MG PO TABS: 10.0000 mg | ORAL_TABLET | Freq: Every day | ORAL | Status: DC

## 2015-11-25 NOTE — Progress Notes (Addendum)
Patient ID: Calvin Fernandez, male   DOB: June 25, 1956, 60 y.o.   MRN: [id number]     Clinical Summary Calvin Fernandez is a 60 y.o.male seen today for follow up of the following medical problems.   1. HTN - home bps rus 120s/80s - compliant with meds  - last visit we decreased his HCTZ to 12.5mg  daily due to frequent urination. Symptoms improved but not resolved   2. History of chest pain - negative stress echo in 2013 - denies any recent symptoms   SH: . 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   Past Medical History  Diagnosis Date  . Chest pain 2011    mild aortic plaque on CT of abdomen; negative stress echocardiogram in 09/2011  . GERD (gastroesophageal reflux disease)   . Hypertension     multiple drug intolerances; noncompliance  . Wears glasses      Allergies  Allergen Reactions  . Tetanus Toxoid Rash     Current Outpatient Prescriptions  Medication Sig Dispense Refill  . amLODipine (NORVASC) 5 MG tablet Take 5 mg by mouth daily.   3  . amLODipine (NORVASC) 5 MG tablet TAKE 1 TABLET BY MOUTH EVERY DAY 90 tablet 3  . aspirin 81 MG tablet Take 81 mg by mouth daily.    . hydrochlorothiazide (HYDRODIURIL) 25 MG tablet Take 0.5 tablets (12.5 mg total) by mouth daily. 45 tablet 3  . metoprolol tartrate (LOPRESSOR) 25 MG tablet TAKE 1/2 TABLET BY MOUTH TWICE DAILY 90 tablet 3  . [DISCONTINUED] metoprolol succinate (TOPROL-XL) 25 MG 24 hr tablet Take 25 mg by mouth 3 (three) times daily.      No current facility-administered medications for this visit.     Past Surgical History  Procedure Laterality Date  . Extensor tendon of forearm / wrist repair  1990    left  . Hip pinning,cannulated Right 11/12/2014    Procedure: PERCUTANEOUS PINNING RIGHT HIP ;  Surgeon: Gean BirchwoodFrank Rowan, MD;  Location: [facility];  Service: Orthopedics;  Laterality: Right;     Allergies  Allergen Reactions  . Tetanus  Toxoid Rash      Family History  Problem Relation Age of Onset  . Heart attack Father 3755     Social History Calvin Fernandez reports that he has never smoked. He has never used smokeless tobacco. Calvin Fernandez reports that he does not drink alcohol.   Review of Systems CONSTITUTIONAL: No weight loss, fever, chills, weakness or fatigue.  HEENT: Eyes: No visual loss, blurred vision, double vision or yellow sclerae.No hearing loss, sneezing, congestion, runny nose or sore throat.  SKIN: No rash or itching.  CARDIOVASCULAR: per HPI RESPIRATORY: No shortness of breath, cough or sputum.  GASTROINTESTINAL: No anorexia, nausea, vomiting or diarrhea. No abdominal pain or blood.  GENITOURINARY: No burning on urination, no polyuria NEUROLOGICAL: No headache, dizziness, syncope, paralysis, ataxia, numbness or tingling in the extremities. No change in bowel or bladder control.  MUSCULOSKELETAL: No muscle, back pain, joint pain or stiffness.  LYMPHATICS: No enlarged nodes. No history of splenectomy.  PSYCHIATRIC: No history of depression or anxiety.  ENDOCRINOLOGIC: No reports of sweating, cold or heat intolerance. No polyuria or polydipsia.  Calvin Fernandez.   Physical Examination Filed Vitals:   11/25/15 0812  BP: 138/92  Pulse: 59   Filed Vitals:   11/25/15 0812  Height: 6\' 2"  (1.88 m)  Weight: 212 lb (96.163 kg)    Gen: resting comfortably, no  acute distress HEENT: no scleral icterus, pupils equal round and reactive, no palptable cervical adenopathy,  CV: RRR, no m/r/g, no jvd Resp: Clear to auscultation bilaterally GI: abdomen is soft, non-tender, non-distended, normal bowel sounds, no hepatosplenomegaly MSK: extremities are warm, no edema.  Skin: warm, no rash Neuro:  no focal deficits Psych: appropriate affect   Diagnostic Studies 09/2011 Stress Echo Study Conclusions  - Stress ECG conclusions: The stress ECG was negative for ischemia. - Staged echo: There was no echocardiographic  evidence for stress-induced ischemia. Bruce protocol. Stress echocardiography. Height: Height: 188cm. Height: 74in. Weight: Weight: 104.1kg. Weight: 229lb. Body mass index: BMI: 29.5kg/m^2. Body surface area:  BSA: 2.79m^2. Blood pressure:   162/90. Patient status: Outpatient.    Assessment and Plan   1. HTN - at goal. Frequent urination on HCTZ, we will d/c and increase his norvasc to  daily  2. Chest pain - no recent symptoms, previous negative stress test. EKG in clinic without ischemic changes.  - continue to monitor   F/u 1 year. Obtain annual labs.       Calvin Fernandez, M.D.

## 2015-11-25 NOTE — Patient Instructions (Signed)
Your physician wants you to follow-up in: 1 Year with Dr. Wyline MoodBranch. You will receive a reminder letter in the mail two months in advance. If you don't receive a letter, please call our office to schedule the follow-up appointment.  Your physician has recommended you make the following change in your medication:   STOP Taking Hydrochlorothiazide   Increase Norvasc to 10 mg Daily  Your physician recommends that you return for lab work in: Fasting ( Nothing to eat or drink after midnight)  If you need a refill on your cardiac medications before your next appointment, please call your pharmacy.  Thank you for choosing [facility]!

## 2015-12-02 ENCOUNTER — Other Ambulatory Visit: Payer: Self-pay

## 2015-12-02 ENCOUNTER — Telehealth: Payer: Self-pay | Admitting: Cardiology

## 2015-12-02 NOTE — Telephone Encounter (Signed)
Pt said since you stopped HCTZ  Last week and doubled norvasc he was jittery with BP 169/110.he put himself back on HCTZ and lowered dose of Norvasc back to original and feels fine,did not take BP again.

## 2015-12-02 NOTE — Telephone Encounter (Signed)
Patient would like to speak to nurse regarding medication changes at last visit . / tg

## 2015-12-02 NOTE — Telephone Encounter (Signed)
We had stopped HCTZ due to frequent urination. Looks like the doubled norvasc was not enough to control his bp. We have 2 options. He can continue HCTZ and norvasc that he is back on, or we can consider an alternative to HCTZ to use with norvasc. Let me know if he wants to try a new pill.    Dominga FerryJ Jamiaya Bina MD

## 2015-12-03 MED ORDER — AMLODIPINE BESYLATE 5 MG PO TABS: 5.0000 mg | ORAL_TABLET | Freq: Every day | ORAL | Status: DC

## 2015-12-03 MED ORDER — HYDROCHLOROTHIAZIDE 25 MG PO TABS: 12.5000 mg | ORAL_TABLET | Freq: Every day | ORAL | Status: DC

## 2015-12-03 NOTE — Telephone Encounter (Signed)
Pt wants to stay back on old dosages norvasc 5 mg and HCTZ 12.5 mg daily

## 2016-05-11 ENCOUNTER — Other Ambulatory Visit: Payer: Self-pay | Admitting: Cardiology

## 2016-05-15 ENCOUNTER — Telehealth: Payer: Self-pay | Admitting: Cardiology

## 2016-05-15 MED ORDER — HYDROCHLOROTHIAZIDE 12.5 MG PO CAPS: 12.5000 mg | ORAL_CAPSULE | Freq: Every day | ORAL | 3 refills | Status: DC

## 2016-05-15 NOTE — Telephone Encounter (Signed)
Please give pt a call concerning one of his medications --he's unable to get it filled (not sure of the name) pt stopped by yesterday morning and spoke w/ Bradly BienenstockKisha concerning this .

## 2016-05-15 NOTE — Telephone Encounter (Signed)
Pt was confused because his HCTZ was decreased from 25 mg, Sig: 1/2 tab a day and he was given a refill for 12.5 mg tablet, sig: take 1 a day. I cancelled old rx of 25 mg and e-escribed HCTZ 12.5 mg tablet daily for him

## 2016-10-12 ENCOUNTER — Encounter: Payer: Self-pay | Admitting: Cardiology

## 2016-10-12 ENCOUNTER — Ambulatory Visit (INDEPENDENT_AMBULATORY_CARE_PROVIDER_SITE_OTHER): Payer: BC Managed Care – PPO | Admitting: Cardiology

## 2016-10-12 VITALS — BP 143/83 | HR 56 | Ht 74.0 in | Wt 209.4 lb

## 2016-10-12 DIAGNOSIS — R002 Palpitations: Secondary | ICD-10-CM | POA: Diagnosis not present

## 2016-10-12 DIAGNOSIS — I10 Essential (primary) hypertension: Secondary | ICD-10-CM | POA: Diagnosis not present

## 2016-10-12 DIAGNOSIS — Z125 Encounter for screening for malignant neoplasm of prostate: Secondary | ICD-10-CM

## 2016-10-12 DIAGNOSIS — R7309 Other abnormal glucose: Secondary | ICD-10-CM

## 2016-10-12 MED ORDER — HYDROCHLOROTHIAZIDE 12.5 MG PO TABS: 12.5000 mg | ORAL_TABLET | Freq: Every day | ORAL | 3 refills | Status: DC

## 2016-10-12 MED ORDER — AMLODIPINE BESYLATE 2.5 MG PO TABS: 2.5000 mg | ORAL_TABLET | Freq: Every day | ORAL | 3 refills | Status: DC

## 2016-10-12 NOTE — Patient Instructions (Signed)
Medication Instructions:  Continue all current medications.  Labwork:  CMET, CBC, FLP, Magnesium, HgA1C, TSH - orders given today.  Office will contact with results via phone or letter.    Testing/Procedures: none  Follow-Up: Your physician wants you to follow up in:  1 year.  You will receive a reminder letter in the mail one-two months in advance.  If you don't receive a letter, please call our office to schedule the follow up appointment   Any Other Special Instructions Will Be Listed Below (If Applicable).  If you need a refill on your cardiac medications before your next appointment, please call your pharmacy.

## 2016-10-12 NOTE — Progress Notes (Signed)
Clinical Summary Calvin Fernandez is a 61 y.o.male seen today for follow up of the following medical problems.   1. HTN - last visit we stopped his HCTZ due to frequent urination, prior to that he tried lowering the dose without any improvement. Norvasc was increased to  daily.  - this change made him feel "jittery". he ended up restarting his HCTZ 12.5 and lowering his norvasc back to 2.5mg  daily.  - home bps 130s/80s   2. Palpitations - isolated episode, only lasted a few minutes.     Past Medical History:  Diagnosis Date  . Chest pain 2011   mild aortic plaque on CT of abdomen; negative stress echocardiogram in 09/2011  . GERD (gastroesophageal reflux disease)   . Hypertension    multiple drug intolerances; noncompliance  . Wears glasses      Allergies  Allergen Reactions  . Tetanus Toxoid Rash     Current Outpatient Prescriptions  Medication Sig Dispense Refill  . amLODipine (NORVASC) 5 MG tablet Take 1 tablet (5 mg total) by mouth daily. 180 tablet 3  . aspirin 81 MG tablet Take 81 mg by mouth daily.    . hydrochlorothiazide (MICROZIDE) 12.5 MG capsule Take 1 capsule (12.5 mg total) by mouth daily. 90 capsule 3  . metoprolol tartrate (LOPRESSOR) 25 MG tablet TAKE 1/2 TABLET BY MOUTH TWICE DAILY 90 tablet 3   No current facility-administered medications for this visit.      Past Surgical History:  Procedure Laterality Date  . EXTENSOR TENDON OF FOREARM / WRIST REPAIR  1990   left  . HIP PINNING,CANNULATED Right 11/12/2014   Procedure: PERCUTANEOUS PINNING RIGHT HIP ;  Surgeon: Gean Birchwood, MD;  Location: [facility];  Service: Orthopedics;  Laterality: Right;     Allergies  Allergen Reactions  . Tetanus Toxoid Rash      Family History  Problem Relation Age of Onset  . Heart attack Father 42     Social History Mr. Haithcock reports that he has never smoked. He has never used smokeless tobacco. Mr. Pillsbury reports that he does not  drink alcohol.   Review of Systems CONSTITUTIONAL: No weight loss, fever, chills, weakness or fatigue.  HEENT: Eyes: No visual loss, blurred vision, double vision or yellow sclerae.No hearing loss, sneezing, congestion, runny nose or sore throat.  SKIN: No rash or itching.  CARDIOVASCULAR: per hpi RESPIRATORY: No shortness of breath, cough or sputum.  GASTROINTESTINAL: No anorexia, nausea, vomiting or diarrhea. No abdominal pain or blood.  GENITOURINARY: No burning on urination, no polyuria NEUROLOGICAL: No headache, dizziness, syncope, paralysis, ataxia, numbness or tingling in the extremities. No change in bowel or bladder control.  MUSCULOSKELETAL: No muscle, back pain, joint pain or stiffness.  LYMPHATICS: No enlarged nodes. No history of splenectomy.  PSYCHIATRIC: No history of depression or anxiety.  ENDOCRINOLOGIC: No reports of sweating, cold or heat intolerance. No polyuria or polydipsia.  Marland Kitchen   Physical Examination Vitals:   10/12/16 1420  BP: (!) 143/83  Pulse: (!) 56   Vitals:   10/12/16 1420  Weight: 209 lb 6.4 oz (95 kg)  Height:  (1.88 m)    Gen: resting comfortably, no acute distress HEENT: no scleral icterus, pupils equal round and reactive, no palptable cervical adenopathy,  CV: RRR, no m/r/g, no jvd Resp: Clear to auscultation bilaterally GI: abdomen is soft, non-tender, non-distended, normal bowel sounds, no hepatosplenomegaly MSK: extremities are warm, no edema.  Skin: warm, no rash Neuro:  no focal deficits Psych: appropriate affect   Diagnostic Studies 09/2011 Stress Echo Study Conclusions  - Stress ECG conclusions: The stress ECG was negative for ischemia. - Staged echo: There was no echocardiographic evidence for stress-induced ischemia. Bruce protocol. Stress echocardiography. Height: Height: 188cm. Height: 74in. Weight: Weight: 104.1kg. Weight: 229lb. Body mass index: BMI: 29.5kg/m^2. Body surface area:  BSA: 2.59m^2.  Blood pressure:   162/90. Patient status: Outpatient.    Assessment and Plan   1. HTN - at goal. He will continue current meds  2. Palpitations - EKG in clinic shows SR - isolated episodes, continue to monitor   F/u 1 year. CHeck annual labs       Antoine Poche, M.D.

## 2016-10-21 ENCOUNTER — Other Ambulatory Visit: Payer: Self-pay | Admitting: Cardiology

## 2016-11-04 ENCOUNTER — Other Ambulatory Visit: Payer: Self-pay | Admitting: Cardiology

## 2016-11-05 LAB — COMPREHENSIVE METABOLIC PANEL
A/G RATIO: 1.4 (ref 1.2–2.2)
ALK PHOS: 63 IU/L (ref 39–117)
ALT: 17 IU/L (ref 0–44)
AST: 20 IU/L (ref 0–40)
Albumin: 4.3 g/dL (ref 3.6–4.8)
BILIRUBIN TOTAL: 0.6 mg/dL (ref 0.0–1.2)
BUN/Creatinine Ratio: 19 (ref 10–24)
BUN: 19 mg/dL (ref 8–27)
CHLORIDE: 100 mmol/L (ref 96–106)
CO2: 26 mmol/L (ref 18–29)
Calcium: 9.5 mg/dL (ref 8.6–10.2)
Creatinine, Ser: 0.98 mg/dL (ref 0.76–1.27)
GFR calc Af Amer: 95 mL/min/{1.73_m2} (ref >59)
GFR calc non Af Amer: 82 mL/min/{1.73_m2} (ref >59)
Globulin, Total: 3.1 g/dL (ref 1.5–4.5)
Glucose: 92 mg/dL (ref 65–99)
POTASSIUM: 4.9 mmol/L (ref 3.5–5.2)
Sodium: 141 mmol/L (ref 134–144)
Total Protein: 7.4 g/dL (ref 6.0–8.5)

## 2016-11-05 LAB — CBC WITH DIFFERENTIAL/PLATELET
Basophils Absolute: 0 10*3/uL (ref 0.0–0.2)
Basos: 0 %
EOS (ABSOLUTE): 0.2 10*3/uL (ref 0.0–0.4)
Eos: 3 %
Hematocrit: 49.8 % (ref 37.5–51.0)
Hemoglobin: 17.1 g/dL (ref 13.0–17.7)
IMMATURE GRANS (ABS): 0 10*3/uL (ref 0.0–0.1)
Immature Granulocytes: 0 %
LYMPHS ABS: 1.9 10*3/uL (ref 0.7–3.1)
LYMPHS: 27 %
MCH: 31.3 pg (ref 26.6–33.0)
MCHC: 34.3 g/dL (ref 31.5–35.7)
MCV: 91 fL (ref 79–97)
MONOS ABS: 0.4 10*3/uL (ref 0.1–0.9)
Monocytes: 6 %
NEUTROS ABS: 4.5 10*3/uL (ref 1.4–7.0)
Neutrophils: 64 %
PLATELETS: 266 10*3/uL (ref 150–379)
RBC: 5.46 x10E6/uL (ref 4.14–5.80)
RDW: 13.6 % (ref 12.3–15.4)
WBC: 7.1 10*3/uL (ref 3.4–10.8)

## 2016-11-05 LAB — LIPID PANEL W/O CHOL/HDL RATIO
CHOLESTEROL TOTAL: 194 mg/dL (ref 100–199)
HDL: 49 mg/dL (ref >39)
LDL Calculated: 128 mg/dL — ABNORMAL HIGH (ref 0–99)
Triglycerides: 86 mg/dL (ref 0–149)
VLDL CHOLESTEROL CAL: 17 mg/dL (ref 5–40)

## 2016-11-05 LAB — AMBIG ABBREV CMP14 DEFAULT

## 2016-11-05 LAB — TSH: TSH: 3.42 u[IU]/mL (ref 0.450–4.500)

## 2016-11-05 LAB — MAGNESIUM: MAGNESIUM: 2.2 mg/dL (ref 1.6–2.3)

## 2016-11-05 LAB — HGB A1C W/O EAG: Hgb A1c MFr Bld: 5.2 % (ref 4.8–5.6)

## 2016-11-26 ENCOUNTER — Ambulatory Visit: Payer: BC Managed Care – PPO | Admitting: Cardiology

## 2017-11-03 ENCOUNTER — Ambulatory Visit: Payer: BC Managed Care – PPO | Admitting: Cardiology

## 2017-11-03 ENCOUNTER — Encounter: Payer: Self-pay | Admitting: Cardiology

## 2017-11-03 VITALS — BP 147/92 | HR 67 | Wt 214.0 lb

## 2017-11-03 DIAGNOSIS — R002 Palpitations: Secondary | ICD-10-CM | POA: Diagnosis not present

## 2017-11-03 DIAGNOSIS — E785 Hyperlipidemia, unspecified: Secondary | ICD-10-CM | POA: Diagnosis not present

## 2017-11-03 DIAGNOSIS — I10 Essential (primary) hypertension: Secondary | ICD-10-CM | POA: Diagnosis not present

## 2017-11-03 NOTE — Patient Instructions (Signed)
Medication Instructions:  Stop aspirin   Labwork: As soon as possible   Testing/Procedures: none  Follow-Up: Your physician wants you to follow-up in: 1 year.  You will receive a reminder letter in the mail two months in advance. If you don't receive a letter, please call our office to schedule the follow-up appointment.   Any Other Special Instructions Will Be Listed Below (If Applicable).     If you need a refill on your cardiac medications before your next appointment, please call your pharmacy.

## 2017-11-03 NOTE — Progress Notes (Signed)
Clinical Summary Mr. Coye is a 62 y.o.male seen today for follow up of the following medical problems.   1. HTN - last visit we stopped his HCTZ due to frequent urination, prior to that he tried lowering the dose without any improvement. Norvasc was increased to 10mg  daily.  - this change made him feel "jittery". he ended up restarting his HCTZ 12.5 and lowering his norvasc back to 2.5mg  daily.   - home bps 120s/70-80s - recent issues with shoulder pain, has been taking some iburprofen at home that may affect his bp   2. History of Palpitations - no recent palpitaitons.    Upcoming labs with pcp Past Medical History:  Diagnosis Date  . Chest pain 2011   mild aortic plaque on CT of abdomen; negative stress echocardiogram in 09/2011  . GERD (gastroesophageal reflux disease)   . Hypertension    multiple drug intolerances; noncompliance  . Wears glasses      Allergies  Allergen Reactions  . Tetanus Toxoid Rash     Current Outpatient Medications  Medication Sig Dispense Refill  . amLODipine (NORVASC) 2.5 MG tablet Take 1 tablet (2.5 mg total) by mouth daily. 90 tablet 3  . aspirin 81 MG tablet Take 81 mg by mouth daily.    . hydrochlorothiazide (HYDRODIURIL) 12.5 MG tablet Take 1 tablet (12.5 mg total) by mouth daily. 90 tablet 3  . metoprolol tartrate (LOPRESSOR) 25 MG tablet TAKE 1/2 TABLET BY MOUTH TWICE DAILY 90 tablet 3   No current facility-administered medications for this visit.      Past Surgical History:  Procedure Laterality Date  . EXTENSOR TENDON OF FOREARM / WRIST REPAIR  1990   left  . HIP PINNING,CANNULATED Right 11/12/2014   Procedure: PERCUTANEOUS PINNING RIGHT HIP ;  Surgeon: Gean Birchwood, MD;  Location: [facility];  Service: Orthopedics;  Laterality: Right;     Allergies  Allergen Reactions  . Tetanus Toxoid Rash      Family History  Problem Relation Age of Onset  . Heart attack Father 67     Social  History Mr. Jurney reports that he has never smoked. He has never used smokeless tobacco. Mr. Macho reports that he does not drink alcohol.   Review of Systems CONSTITUTIONAL: No weight loss, fever, chills, weakness or fatigue.  HEENT: Eyes: No visual loss, blurred vision, double vision or yellow sclerae.No hearing loss, sneezing, congestion, runny nose or sore throat.  SKIN: No rash or itching.  CARDIOVASCULAR: per hpi RESPIRATORY: No shortness of breath, cough or sputum.  GASTROINTESTINAL: No anorexia, nausea, vomiting or diarrhea. No abdominal pain or blood.  GENITOURINARY: No burning on urination, no polyuria NEUROLOGICAL: No headache, dizziness, syncope, paralysis, ataxia, numbness or tingling in the extremities. No change in bowel or bladder control.  MUSCULOSKELETAL: No muscle, back pain, joint pain or stiffness.  LYMPHATICS: No enlarged nodes. No history of splenectomy.  PSYCHIATRIC: No history of depression or anxiety.  ENDOCRINOLOGIC: No reports of sweating, cold or heat intolerance. No polyuria or polydipsia.  Marland Kitchen   Physical Examination Vitals:   11/03/17 0821  BP: (!) 147/92  Pulse: 67  SpO2: 98%   Vitals:   11/03/17 0821  Weight: 214 lb (97.1 kg)    Gen: resting comfortably, no acute distress HEENT: no scleral icterus, pupils equal round and reactive, no palptable cervical adenopathy,  CV: RRR, no m/r/g, no jvd Resp: Clear to auscultation bilaterally GI: abdomen is soft, non-tender, non-distended, normal bowel sounds,  no hepatosplenomegaly MSK: extremities are warm, no edema.  Skin: warm, no rash Neuro:  no focal deficits Psych: appropriate affect   Diagnostic Studies 09/2011 Stress Echo Study Conclusions  - Stress ECG conclusions: The stress ECG was negative for ischemia. - Staged echo: There was no echocardiographic evidence for stress-induced ischemia. Bruce protocol. Stress echocardiography. Height: Height: 188cm. Height: 74in. Weight:  Weight: 104.1kg. Weight: 229lb. Body mass index: BMI: 29.5kg/m^2. Body surface area:  BSA: 2.7315m^2. Blood pressure:   162/90. Patient status: Outpatient.      Assessment and Plan    1. HTN - at goal based on home bp's - did not tolerate higher doses of norvasc or HCTZ, on low dose beta blocker for palpitations - continue current meds  2. Palpitations - no recent symptoms, continue beta blocker - EKG today shows NSR   F/u 1 year. We will check annual labs      Antoine PocheJonathan F. Larsen Zettel, M.D.

## 2017-11-12 ENCOUNTER — Other Ambulatory Visit: Payer: Self-pay | Admitting: Cardiology

## 2017-11-15 ENCOUNTER — Other Ambulatory Visit: Payer: Self-pay | Admitting: Cardiology

## 2017-11-18 LAB — CMP12+TSH
AST: 19 IU/L (ref 0–40)
Albumin/Globulin Ratio: 1.9 (ref 1.2–2.2)
Albumin: 4.3 g/dL (ref 3.6–4.8)
Alkaline Phosphatase: 49 IU/L (ref 39–117)
BILIRUBIN TOTAL: 0.7 mg/dL (ref 0.0–1.2)
BUN/Creatinine Ratio: 17 (ref 10–24)
BUN: 18 mg/dL (ref 8–27)
CALCIUM: 9.5 mg/dL (ref 8.6–10.2)
CHLORIDE: 103 mmol/L (ref 96–106)
Creatinine, Ser: 1.07 mg/dL (ref 0.76–1.27)
GFR, EST AFRICAN AMERICAN: 85 mL/min/{1.73_m2} (ref >59)
GFR, EST NON AFRICAN AMERICAN: 73 mL/min/{1.73_m2} (ref >59)
Globulin, Total: 2.3 g/dL (ref 1.5–4.5)
Glucose: 90 mg/dL (ref 65–99)
POTASSIUM: 4.8 mmol/L (ref 3.5–5.2)
Sodium: 143 mmol/L (ref 134–144)
TOTAL PROTEIN: 6.6 g/dL (ref 6.0–8.5)
TSH: 2.25 u[IU]/mL (ref 0.450–4.500)

## 2017-11-18 LAB — LIPID PANEL
CHOLESTEROL TOTAL: 174 mg/dL (ref 100–199)
Chol/HDL Ratio: 3.5 ratio (ref 0.0–5.0)
HDL: 50 mg/dL (ref >39)
LDL CALC: 105 mg/dL — AB (ref 0–99)
TRIGLYCERIDES: 93 mg/dL (ref 0–149)
VLDL CHOLESTEROL CAL: 19 mg/dL (ref 5–40)

## 2017-11-18 LAB — HEMOGLOBIN A1C
Estimated Average Glucose: 100 mg/dL
Hemoglobin A1c: 5.1 %Hb

## 2017-11-18 LAB — CBC WITH DIFFERENTIAL/PLATELET
BASOS ABS: 0.1 10*3/uL (ref 0.0–0.2)
Basos: 1 %
EOS (ABSOLUTE): 0.3 10*3/uL (ref 0.0–0.4)
Eos: 4 %
HEMOGLOBIN: 15.9 g/dL (ref 13.0–17.7)
Hematocrit: 47.1 % (ref 37.5–51.0)
IMMATURE GRANULOCYTES: 0 %
Immature Grans (Abs): 0 10*3/uL (ref 0.0–0.1)
LYMPHS: 27 %
Lymphocytes Absolute: 1.9 10*3/uL (ref 0.7–3.1)
MCH: 31.2 pg (ref 26.6–33.0)
MCHC: 33.8 g/dL (ref 31.5–35.7)
MCV: 93 fL (ref 79–97)
MONOCYTES: 6 %
Monocytes Absolute: 0.4 10*3/uL (ref 0.1–0.9)
NEUTROS ABS: 4.4 10*3/uL (ref 1.4–7.0)
NEUTROS PCT: 62 %
PLATELETS: 256 10*3/uL (ref 150–379)
RBC: 5.09 x10E6/uL (ref 4.14–5.80)
RDW: 13.1 % (ref 12.3–15.4)
WBC: 7.2 10*3/uL (ref 3.4–10.8)

## 2017-11-18 LAB — MAGNESIUM: MAGNESIUM: 2.1 mg/dL (ref 1.6–2.3)

## 2017-11-18 LAB — PSA: PROSTATE SPECIFIC AG, SERUM: 2.7 ng/mL (ref 0.0–4.0)

## 2018-08-12 ENCOUNTER — Other Ambulatory Visit: Payer: Self-pay | Admitting: Cardiology

## 2018-09-05 ENCOUNTER — Telehealth: Payer: Self-pay | Admitting: Cardiology

## 2018-09-05 MED ORDER — HYDROCHLOROTHIAZIDE 12.5 MG PO TABS: ORAL_TABLET | ORAL | 0 refills | Status: DC

## 2018-09-05 MED ORDER — AMLODIPINE BESYLATE 2.5 MG PO TABS: ORAL_TABLET | ORAL | 0 refills | Status: DC

## 2018-09-05 MED ORDER — METOPROLOL TARTRATE 25 MG PO TABS: 12.5000 mg | ORAL_TABLET | Freq: Two times a day (BID) | ORAL | 0 refills | Status: DC

## 2018-09-05 NOTE — Telephone Encounter (Signed)
Needing refills on all his meds, only got a 30 day supply from pharmacy. He's scheduled to see JB in April

## 2018-09-05 NOTE — Telephone Encounter (Signed)
Refill complete, 3 month supply given.

## 2018-10-11 ENCOUNTER — Other Ambulatory Visit: Payer: Self-pay | Admitting: Cardiology

## 2018-10-27 ENCOUNTER — Telehealth: Payer: Self-pay | Admitting: Cardiology

## 2018-10-27 NOTE — Telephone Encounter (Signed)
Virtual Visit Pre-Appointment Phone Call  Steps For Call:  1. Confirm consent - "In the setting of the current Covid19 crisis, you are scheduled for a (phone or video) visit with your provider on (date) at (time).  Just as we do with many in-office visits, in order for you to participate in this visit, we must obtain consent.  If you'd like, I can send this to your mychart (if signed up) or email for you to review.  Otherwise, I can obtain your verbal consent now.  All virtual visits are billed to your insurance company just like a normal visit would be.  By agreeing to a virtual visit, we'd like you to understand that the technology does not allow for your provider to perform an examination, and thus may limit your provider's ability to fully assess your condition.  Finally, though the technology is pretty good, we cannot assure that it will always work on either your or our end, and in the setting of a video visit, we may have to convert it to a phone-only visit.  In either situation, we cannot ensure that we have a secure connection.  Are you willing to proceed?" STAFF: Did the patient verbally acknowledge consent to telehealth visit? Document YES/NO here:  YES   2. Confirm the BEST phone number to call the day of the visit by including in appointment notes   [phone]  3. Give patient instructions for WebEx/MyChart download to smartphone as below or Doximity/Doxy.me if video visit (depending on what platform provider is using)  4. Advise patient to be prepared with their blood pressure, heart rate, weight, any heart rhythm information, their current medicines, and a piece of paper and pen handy for any instructions they may receive the day of their visit  5. Inform patient they will receive a phone call 15 minutes prior to their appointment time (may be from unknown caller ID) so they should be prepared to answer  6. Confirm that appointment type is correct in Epic appointment notes (VIDEO  vs PHONE)     TELEPHONE CALL NOTE  Calvin Fernandez has been deemed a candidate for a follow-up tele-health visit to limit community exposure during the Covid-19 pandemic. I spoke with the patient via phone to ensure availability of phone/video source, confirm preferred email & phone number, and discuss instructions and expectations.  I reminded Calvin Fernandez to be prepared with any vital sign and/or heart rhythm information that could potentially be obtained via home monitoring, at the time of his visit. I reminded Calvin Fernandez to expect a phone call at the time of his visit if his visit.  Donata DuffVicky T Slaughter 10/27/2018 10:06 AM   INSTRUCTIONS FOR DOWNLOADING THE WEBEX APP TO SMARTPHONE  - If Apple, ask patient to go to [employer] Store and type in WebEx in the search bar. Download [employer]Webex Meetings, the blue/green circle. If Android, go to [employer] Play Store and type in [employer]WebEx in the search bar. The app is free but as with any other app downloads, their phone may require them to verify saved payment information or Apple/Android password.  - The patient does NOT have to create an account. - On the day of the visit, the assist will walk the patient through joining the meeting with the meeting number/password.  INSTRUCTIONS FOR DOWNLOADING THE MYCHART APP TO SMARTPHONE  - The patient must first make sure to have activated MyChart and know their login information - If Apple, go to  App Store and type in [employer] in the search bar and download the app. If Android, ask patient to go to [employer] and type in [place] in the search bar and download the app. The app is free but as with any other app downloads, their phone may require them to verify saved payment information or Apple/Android password.  - The patient will need to then log into the app with their MyChart username and password, and select [facility] as their healthcare provider to link the account. When it is time for your  visit, go to the MyChart app, find appointments, and click Begin Video Visit. Be sure to Select Allow for your device to access the Microphone and Camera for your visit. You will then be connected, and your provider will be with you shortly.  **If they have any issues connecting, or need assistance please contact MyChart service desk [phone])**  **If using a computer, in order to ensure the best quality for their visit they will need to use either of the following Internet Browsers: [employer], or Google Chrome**  IF USING DOXIMITY or DOXY.ME - The patient will receive a link just prior to their visit, either by text or email (to be determined day of appointment depending on if it's doxy.me or Doximity).     FULL LENGTH CONSENT FOR TELE-HEALTH VISIT   I hereby voluntarily request, consent and authorize [facility] and its employed or contracted physicians, physician assistants, nurse practitioners or other licensed health care professionals (the Practitioner), to provide me with telemedicine health care services (the Services") as deemed necessary by the treating Practitioner. I acknowledge and consent to receive the Services by the Practitioner via telemedicine. I understand that the telemedicine visit will involve communicating with the Practitioner through live audiovisual communication technology and the disclosure of certain medical information by electronic transmission. I acknowledge that I have been given the opportunity to request an in-person assessment or other available alternative prior to the telemedicine visit and am voluntarily participating in the telemedicine visit.  I understand that I have the right to withhold or withdraw my consent to the use of telemedicine in the course of my care at any time, without affecting my right to future care or treatment, and that the Practitioner or I may terminate the telemedicine visit at any time. I understand that I have  the right to inspect all information obtained and/or recorded in the course of the telemedicine visit and may receive copies of available information for a reasonable fee.  I understand that some of the potential risks of receiving the Services via telemedicine include:   Delay or interruption in medical evaluation due to technological equipment failure or disruption;  Information transmitted may not be sufficient (e.g. poor resolution of images) to allow for appropriate medical decision making by the Practitioner; and/or   In rare instances, security protocols could fail, causing a breach of personal health information.  Furthermore, I acknowledge that it is my responsibility to provide information about my medical history, conditions and care that is complete and accurate to the best of my ability. I acknowledge that Practitioner's advice, recommendations, and/or decision may be based on factors not within their control, such as incomplete or inaccurate data provided by me or distortions of diagnostic images or specimens that may result from electronic transmissions. I understand that the practice of medicine is not an exact science and that Practitioner makes no warranties or guarantees regarding treatment outcomes. I acknowledge that  I will receive a copy of this consent concurrently upon execution via email to the email address I last provided but may also request a printed copy by calling the office of [facility].    I understand that my insurance will be billed for this visit.   I have read or had this consent read to me.  I understand the contents of this consent, which adequately explains the benefits and risks of the Services being provided via telemedicine.   I have been provided ample opportunity to ask questions regarding this consent and the Services and have had my questions answered to my satisfaction.  I give my informed consent for the services to be provided through the use  of telemedicine in my medical care  By participating in this telemedicine visit I agree to the above.

## 2018-11-03 ENCOUNTER — Encounter: Payer: Self-pay | Admitting: Cardiology

## 2018-11-03 ENCOUNTER — Telehealth (INDEPENDENT_AMBULATORY_CARE_PROVIDER_SITE_OTHER): Payer: BC Managed Care – PPO | Admitting: Cardiology

## 2018-11-03 VITALS — BP 137/86 | HR 58 | Ht 74.0 in | Wt 208.0 lb

## 2018-11-03 DIAGNOSIS — I10 Essential (primary) hypertension: Secondary | ICD-10-CM

## 2018-11-03 DIAGNOSIS — E785 Hyperlipidemia, unspecified: Secondary | ICD-10-CM

## 2018-11-03 DIAGNOSIS — R002 Palpitations: Secondary | ICD-10-CM

## 2018-11-03 NOTE — Progress Notes (Signed)
Virtual Visit via Telephone Note   This visit type was conducted due to national recommendations for restrictions regarding the COVID-19 Pandemic (e.g. social distancing) in an effort to limit this patient's exposure and mitigate transmission in our community.  Due to his co-morbid illnesses, this patient is at least at moderate risk for complications without adequate follow up.  This format is felt to be most appropriate for this patient at this time.  The patient did not have access to video technology/had technical difficulties with video requiring transitioning to audio format only (telephone).  All issues noted in this document were discussed and addressed.  No physical exam could be performed with this format.  Please refer to the patient's chart for his  consent to telehealth for [facility].   Evaluation Performed:  Follow-up visit  Date:  11/03/2018   ID:  Calvin Fernandez, Calvin Fernandez [birth year], MRN [id number]  Patient Location: Home Provider Location: Home  PCP:  Benita Stabile, MD  Cardiologist:  Dina Rich, MD  Electrophysiologist:  None   Chief Complaint:  1 year follow up  History of Present Illness:    Calvin Fernandez is a 63 y.o. male seen today for follow up of the following medical problems.    1. HTN - last visit we stopped his HCTZ due to frequent urination, prior to that he tried lowering the dose without any improvement. Norvasc was increased to 10mg  daily.  - this change made him feel "jittery". he ended up restarting his HCTZ 12.5 and lowering his norvasc back to2.5mg  daily.    - checks at home about once a week. Typically around 130s/80s  2. History of Palpitations  - no recent symptoms, has done well on low dose lopressor     The patient does not have symptoms concerning for COVID-19 infection (fever, chills, cough, or new shortness of breath).    Past Medical History:  Diagnosis Date  . Chest pain 2011   mild aortic plaque on CT of  abdomen; negative stress echocardiogram in 09/2011  . GERD (gastroesophageal reflux disease)   . Hypertension    multiple drug intolerances; noncompliance  . Wears glasses    Past Surgical History:  Procedure Laterality Date  . EXTENSOR TENDON OF FOREARM / WRIST REPAIR  1990   left  . HIP PINNING,CANNULATED Right 11/12/2014   Procedure: PERCUTANEOUS PINNING RIGHT HIP ;  Surgeon: Gean Birchwood, MD;  Location: [facility];  Service: Orthopedics;  Laterality: Right;     No outpatient medications have been marked as taking for the 11/03/18 encounter (Appointment) with Antoine Poche, MD.     Allergies:   Tetanus toxoid   Social History   Tobacco Use  . Smoking status: Never Smoker  . Smokeless tobacco: Never Used  Substance Use Topics  . Alcohol use: No  . Drug use: No     Family Hx: The patient's family history includes Heart attack (age of onset: 89) in his father.  ROS:   Please see the history of present illness.    All other systems reviewed and are negative.   Prior CV studies:   The following studies were reviewed today:  09/2011 Stress Echo Study Conclusions  - Stress ECG conclusions: The stress ECG was negative for ischemia. - Staged echo: There was no echocardiographic evidence for stress-induced ischemia. Bruce protocol. Stress echocardiography. Height: Height: 188cm. Height: 74in. Weight: Weight: 104.1kg. Weight: 229lb. Body mass index: BMI: 29.5kg/m^2. Body surface area:  BSA: 2.67m^2.  Blood pressure:   162/90. Patient status: Outpatient.  Labs/Other Tests and Data Reviewed:    EKG:  na  Recent Labs: 11/15/2017: BUN 18; Creatinine, Ser 1.07; Hemoglobin 15.9; Magnesium 2.1; Platelets 256; Potassium 4.8; Sodium 143; TSH 2.250   Recent Lipid Panel Lab Results  Component Value Date/Time   CHOL 174 11/15/2017 08:54 AM   TRIG 93 11/15/2017 08:54 AM   HDL 50 11/15/2017 08:54 AM   CHOLHDL 3.5 11/15/2017 08:54 AM   CHOLHDL  3.6 11/21/2013 07:04 AM   LDLCALC 105 (H) 11/15/2017 08:54 AM    Wt Readings from Last 3 Encounters:  11/03/17 214 lb (97.1 kg)  10/12/16 209 lb 6.4 oz (95 kg)  11/25/15 212 lb (96.2 kg)     Objective:    Vital Signs:  bp 137/86 p 58  Normal affect. Normal speech pattern and tone. Comfortable, in no distress. No auditory signs of SOB or wheezing.     ASSESSMENT & PLAN:     1. HTN - did not tolerate higher doses of norvasc or HCTZ, on low dose beta blocker for palpitations - bp reasonably controlled, particularly given side effects on higher doses of bp meds. Discussed dietatary modifcation, weight loss to further help bp  2. Palpitations - no symptoms, continue lopressor          COVID-19 Education: The signs and symptoms of COVID-19 were discussed with the patient and how to seek care for testing (follow up with PCP or arrange E-visit).  The importance of social distancing was discussed today.  Time:   Today, I have spent 15 minutes with the patient with telehealth technology discussing the above problems.     Medication Adjustments/Labs and Tests Ordered: Current medicines are reviewed at length with the patient today.  Concerns regarding medicines are outlined above.   Tests Ordered: No orders of the defined types were placed in this encounter.   Medication Changes: No orders of the defined types were placed in this encounter.   Disposition:  Follow up 1 year. Check annula labs  Signed, Dina RichBranch, Chaelyn Bunyan, MD  11/03/2018 8:09 AM    [facility]

## 2018-11-03 NOTE — Progress Notes (Addendum)
Medication Instructions:  Your physician recommends that you continue on your current medications as directed. Please refer to the Current Medication list given to you today.   Labwork: Please have labs done ( can wait about 2 months due to Covid19)   Testing/Procedures: none  Follow-Up: Your physician wants you to follow-up in: 1 year. You will receive a reminder letter in the mail two months in advance. If you don't receive a letter, please call our office to schedule the follow-up appointment.    Any Other Special Instructions Will Be Listed Below (If Applicable).     If you need a refill on your cardiac medications before your next appointment, please call your pharmacy.

## 2018-11-03 NOTE — Addendum Note (Signed)
Addended byAbelino Derrick R on: 11/03/2018 11:07 AM   Modules accepted: Orders

## 2018-12-06 ENCOUNTER — Other Ambulatory Visit: Payer: Self-pay | Admitting: Cardiovascular Disease

## 2019-10-30 ENCOUNTER — Telehealth: Payer: Self-pay

## 2019-10-30 NOTE — Telephone Encounter (Signed)
  Patient Consent for Virtual Visit         Calvin Fernandez has provided verbal consent on 10/30/2019 for a virtual visit (video or telephone).   CONSENT FOR VIRTUAL VISIT FOR:  Calvin Fernandez  By participating in this virtual visit I agree to the following:  I hereby voluntarily request, consent and authorize [facility] and its employed or contracted physicians, physician assistants, nurse practitioners or other licensed health care professionals (the Practitioner), to provide me with telemedicine health care services (the "Services") as deemed necessary by the treating Practitioner. I acknowledge and consent to receive the Services by the Practitioner via telemedicine. I understand that the telemedicine visit will involve communicating with the Practitioner through live audiovisual communication technology and the disclosure of certain medical information by electronic transmission. I acknowledge that I have been given the opportunity to request an in-person assessment or other available alternative prior to the telemedicine visit and am voluntarily participating in the telemedicine visit.  I understand that I have the right to withhold or withdraw my consent to the use of telemedicine in the course of my care at any time, without affecting my right to future care or treatment, and that the Practitioner or I may terminate the telemedicine visit at any time. I understand that I have the right to inspect all information obtained and/or recorded in the course of the telemedicine visit and may receive copies of available information for a reasonable fee.  I understand that some of the potential risks of receiving the Services via telemedicine include:  Marland Kitchen Delay or interruption in medical evaluation due to technological equipment failure or disruption; . Information transmitted may not be sufficient (e.g. poor resolution of images) to allow for appropriate medical decision making by the  Practitioner; and/or  . In rare instances, security protocols could fail, causing a breach of personal health information.  Furthermore, I acknowledge that it is my responsibility to provide information about my medical history, conditions and care that is complete and accurate to the best of my ability. I acknowledge that Practitioner's advice, recommendations, and/or decision may be based on factors not within their control, such as incomplete or inaccurate data provided by me or distortions of diagnostic images or specimens that may result from electronic transmissions. I understand that the practice of medicine is not an exact science and that Practitioner makes no warranties or guarantees regarding treatment outcomes. I acknowledge that a copy of this consent can be made available to me via my patient portal [facility] MyChart), or I can request a printed copy by calling the office of [facility].    I understand that my insurance will be billed for this visit.   I have read or had this consent read to me. . I understand the contents of this consent, which adequately explains the benefits and risks of the Services being provided via telemedicine.  . I have been provided ample opportunity to ask questions regarding this consent and the Services and have had my questions answered to my satisfaction. . I give my informed consent for the services to be provided through the use of telemedicine in my medical care

## 2019-11-01 ENCOUNTER — Telehealth (INDEPENDENT_AMBULATORY_CARE_PROVIDER_SITE_OTHER): Payer: Medicare PPO | Admitting: Cardiology

## 2019-11-01 ENCOUNTER — Encounter: Payer: Self-pay | Admitting: Cardiology

## 2019-11-01 VITALS — BP 139/80 | HR 63 | Ht 74.0 in | Wt 203.0 lb

## 2019-11-01 DIAGNOSIS — R002 Palpitations: Secondary | ICD-10-CM

## 2019-11-01 DIAGNOSIS — E785 Hyperlipidemia, unspecified: Secondary | ICD-10-CM

## 2019-11-01 DIAGNOSIS — Z125 Encounter for screening for malignant neoplasm of prostate: Secondary | ICD-10-CM

## 2019-11-01 DIAGNOSIS — I10 Essential (primary) hypertension: Secondary | ICD-10-CM | POA: Diagnosis not present

## 2019-11-01 DIAGNOSIS — R7309 Other abnormal glucose: Secondary | ICD-10-CM | POA: Diagnosis not present

## 2019-11-01 NOTE — Patient Instructions (Signed)
Medication Instructions:  Your physician recommends that you continue on your current medications as directed. Please refer to the Current Medication list given to you today.   Labwork: CBC CMET TSH A1C LIPIDS MAGNESIUM PSA  Testing/Procedures: NONE  Follow-Up: Your physician wants you to follow-up in: 1 YEAR. You will receive a reminder letter in the mail two months in advance. If you don't receive a letter, please call our office to schedule the follow-up appointment.   Any Other Special Instructions Will Be Listed Below (If Applicable).     If you need a refill on your cardiac medications before your next appointment, please call your pharmacy.

## 2019-11-01 NOTE — Progress Notes (Signed)
Virtual Visit via Telephone Note   This visit type was conducted due to national recommendations for restrictions regarding the COVID-19 Pandemic (e.g. social distancing) in an effort to limit this patient's exposure and mitigate transmission in our community.  Due to his co-morbid illnesses, this patient is at least at moderate risk for complications without adequate follow up.  This format is felt to be most appropriate for this patient at this time.  The patient did not have access to video technology/had technical difficulties with video requiring transitioning to audio format only (telephone).  All issues noted in this document were discussed and addressed.  No physical exam could be performed with this format.  Please refer to the patient's chart for his  consent to telehealth for [facility].   The patient was identified using 2 identifiers.  Date:  11/01/2019   ID:  Calvin Fernandez, DOB [birth year], MRN [id number]  Patient Location: Home Provider Location: Office  PCP:  Benita Stabile, MD  Cardiologist:  Dina Rich, MD  Electrophysiologist:  None   Evaluation Performed:  Follow-Up Visit  Chief Complaint:  Follow up  History of Present Illness:    Calvin Fernandez is a 64 y.o. male seen today for follow up of the following medical problems.    1. HTN - last visit we stopped his HCTZ due to frequent urination, prior to that he tried lowering the dose without any improvement. Norvasc was increased to 10mg  daily.  - this change made him feel "jittery". he ended up restarting his HCTZ 12.5 and lowering his norvasc back to2.5mg  daily.  - compliant with meds. Home bp's 130s/80s  2. History ofPalpitations - no recent palpitations., has done well on low dose beta blocker    Completed covid vaccine.   His mother in law is also a patient at our practice  The patient does not have symptoms concerning for COVID-19 infection (fever, chills,  cough, or new shortness of breath).    Past Medical History:  Diagnosis Date  . Chest pain 2011   mild aortic plaque on CT of abdomen; negative stress echocardiogram in 09/2011  . GERD (gastroesophageal reflux disease)   . Hypertension    multiple drug intolerances; noncompliance  . Wears glasses    Past Surgical History:  Procedure Laterality Date  . EXTENSOR TENDON OF FOREARM / WRIST REPAIR  1990   left  . HIP PINNING,CANNULATED Right 11/12/2014   Procedure: PERCUTANEOUS PINNING RIGHT HIP ;  Surgeon: 01/12/2015, MD;  Location: [facility];  Service: Orthopedics;  Laterality: Right;     No outpatient medications have been marked as taking for the 11/01/19 encounter (Appointment) with 11/03/19, MD.     Allergies:   Tetanus toxoid   Social History   Tobacco Use  . Smoking status: Never Smoker  . Smokeless tobacco: Never Used  Substance Use Topics  . Alcohol use: No  . Drug use: No     Family Hx: The patient's family history includes Heart attack (age of onset: 9) in his father.  ROS:   Please see the history of present illness.     All other systems reviewed and are negative.   Prior CV studies:   The following studies were reviewed today:  09/2011 Stress Echo Study Conclusions  - Stress ECG conclusions: The stress ECG was negative for ischemia. - Staged echo: There was no echocardiographic evidence for stress-induced ischemia. Bruce protocol. Stress echocardiography. Height: Height: 188cm.  Height: 74in. Weight: Weight: 104.1kg. Weight: 229lb. Body mass index: BMI: 29.5kg/m^2. Body surface area:  BSA: 2.71m^2. Blood pressure:   162/90. Patient status: Outpatient.  Labs/Other Tests and Data Reviewed:    EKG:  No ECG reviewed.  Recent Labs: No results found for requested labs within last 8760 hours.   Recent Lipid Panel Lab Results  Component Value Date/Time   CHOL 174 11/15/2017 08:54 AM   TRIG 93 11/15/2017  08:54 AM   HDL 50 11/15/2017 08:54 AM   CHOLHDL 3.5 11/15/2017 08:54 AM   CHOLHDL 3.6 11/21/2013 07:04 AM   LDLCALC 105 (H) 11/15/2017 08:54 AM    Wt Readings from Last 3 Encounters:  11/03/18 208 lb (94.3 kg)  11/03/17 214 lb (97.1 kg)  10/12/16 209 lb 6.4 oz (95 kg)     Objective:    Vital Signs:   Today's Vitals   11/01/19 0832  BP: 139/80  Pulse: 63  Weight: 203 lb (92.1 kg)  Height: 6\' 2"  (1.88 m)   Body mass index is 26.06 kg/m. Normal affect. Normal speech pattern and tone. Comfortable, no apparent distress. No audible signs of sob or wheezing.   ASSESSMENT & PLAN:    1. HTN - did not tolerate higher doses of norvasc or HCTZ, on low dose beta blocker for palpitations - resasonable bp given prior side effects, continue current regimen  2. Palpitations - no recent symptoms, continue current meds  Order annual labs  COVID-19 Education: The signs and symptoms of COVID-19 were discussed with the patient and how to seek care for testing (follow up with PCP or arrange E-visit).  The importance of social distancing was discussed today.  Time:   Today, I have spent 18 minutes with the patient with telehealth technology discussing the above problems.     Medication Adjustments/Labs and Tests Ordered: Current medicines are reviewed at length with the patient today.  Concerns regarding medicines are outlined above.   Tests Ordered: No orders of the defined types were placed in this encounter.   Medication Changes: No orders of the defined types were placed in this encounter.   Follow Up:  In Person in 1 year(s)  Signed, Carlyle Dolly, MD  11/01/2019 7:43 AM    [place]

## 2019-11-01 NOTE — Addendum Note (Signed)
Addended by: Abelino Derrick R on: 11/01/2019 09:07 AM   Modules accepted: Orders

## 2019-12-11 ENCOUNTER — Other Ambulatory Visit: Payer: Self-pay | Admitting: Cardiology

## 2019-12-13 ENCOUNTER — Other Ambulatory Visit: Payer: Self-pay

## 2019-12-13 MED ORDER — AMLODIPINE BESYLATE 2.5 MG PO TABS: ORAL_TABLET | ORAL | 3 refills | Status: DC

## 2019-12-13 NOTE — Telephone Encounter (Signed)
Refilled amlodipine 2.5 mg qd

## 2020-02-02 LAB — COLOGUARD: COLOGUARD: NEGATIVE

## 2020-02-13 ENCOUNTER — Other Ambulatory Visit: Payer: Self-pay | Admitting: Cardiology

## 2020-09-11 DIAGNOSIS — L0291 Cutaneous abscess, unspecified: Secondary | ICD-10-CM | POA: Diagnosis not present

## 2020-09-11 DIAGNOSIS — Z136 Encounter for screening for cardiovascular disorders: Secondary | ICD-10-CM | POA: Diagnosis not present

## 2020-09-11 DIAGNOSIS — Z8614 Personal history of Methicillin resistant Staphylococcus aureus infection: Secondary | ICD-10-CM | POA: Diagnosis not present

## 2020-09-11 DIAGNOSIS — Z96641 Presence of right artificial hip joint: Secondary | ICD-10-CM | POA: Diagnosis not present

## 2020-09-11 DIAGNOSIS — Z0001 Encounter for general adult medical examination with abnormal findings: Secondary | ICD-10-CM | POA: Diagnosis not present

## 2020-09-11 DIAGNOSIS — Z6828 Body mass index (BMI) 28.0-28.9, adult: Secondary | ICD-10-CM | POA: Diagnosis not present

## 2020-09-11 DIAGNOSIS — H6502 Acute serous otitis media, left ear: Secondary | ICD-10-CM | POA: Diagnosis not present

## 2020-09-11 DIAGNOSIS — I10 Essential (primary) hypertension: Secondary | ICD-10-CM | POA: Diagnosis not present

## 2020-09-11 DIAGNOSIS — Z96651 Presence of right artificial knee joint: Secondary | ICD-10-CM | POA: Diagnosis not present

## 2020-09-18 DIAGNOSIS — Z8614 Personal history of Methicillin resistant Staphylococcus aureus infection: Secondary | ICD-10-CM | POA: Diagnosis not present

## 2020-09-18 DIAGNOSIS — L0291 Cutaneous abscess, unspecified: Secondary | ICD-10-CM | POA: Diagnosis not present

## 2020-09-25 DIAGNOSIS — Z8614 Personal history of Methicillin resistant Staphylococcus aureus infection: Secondary | ICD-10-CM | POA: Diagnosis not present

## 2020-09-25 DIAGNOSIS — L0291 Cutaneous abscess, unspecified: Secondary | ICD-10-CM | POA: Diagnosis not present

## 2020-11-28 ENCOUNTER — Other Ambulatory Visit: Payer: Self-pay | Admitting: Cardiology

## 2020-11-29 ENCOUNTER — Other Ambulatory Visit: Payer: Self-pay | Admitting: Cardiology

## 2021-01-02 ENCOUNTER — Other Ambulatory Visit: Payer: Self-pay | Admitting: Cardiology

## 2021-01-06 ENCOUNTER — Other Ambulatory Visit: Payer: Self-pay

## 2021-01-06 ENCOUNTER — Ambulatory Visit: Payer: Medicare PPO | Admitting: Family Medicine

## 2021-01-06 ENCOUNTER — Encounter: Payer: Self-pay | Admitting: Family Medicine

## 2021-01-06 VITALS — BP 146/100 | HR 64 | Ht 74.0 in | Wt 217.4 lb

## 2021-01-06 DIAGNOSIS — R002 Palpitations: Secondary | ICD-10-CM | POA: Diagnosis not present

## 2021-01-06 DIAGNOSIS — Z79899 Other long term (current) drug therapy: Secondary | ICD-10-CM | POA: Diagnosis not present

## 2021-01-06 DIAGNOSIS — Z13220 Encounter for screening for lipoid disorders: Secondary | ICD-10-CM | POA: Diagnosis not present

## 2021-01-06 DIAGNOSIS — I10 Essential (primary) hypertension: Secondary | ICD-10-CM

## 2021-01-06 DIAGNOSIS — Z125 Encounter for screening for malignant neoplasm of prostate: Secondary | ICD-10-CM

## 2021-01-06 MED ORDER — AMLODIPINE BESYLATE 2.5 MG PO TABS: 2.5000 mg | ORAL_TABLET | Freq: Every day | ORAL | 3 refills | Status: DC

## 2021-01-06 MED ORDER — HYDROCHLOROTHIAZIDE 12.5 MG PO TABS: 12.5000 mg | ORAL_TABLET | Freq: Every day | ORAL | 3 refills | Status: DC

## 2021-01-06 MED ORDER — METOPROLOL TARTRATE 25 MG PO TABS: 12.5000 mg | ORAL_TABLET | Freq: Two times a day (BID) | ORAL | 3 refills | Status: DC

## 2021-01-06 NOTE — Patient Instructions (Signed)
Medication Instructions:  Your physician recommends that you continue on your current medications as directed. Please refer to the Current Medication list given to you today.  Labwork: Your physician recommends that you return for a FASTING labs Please do not eat or drink for at least 8 hours when you have this done. You may take your medications that morning with a sip of water. CMET, Magnesium and PSA. [facility] [place]  Testing/Procedures: none  Follow-Up: Your physician recommends that you schedule a follow-up appointment in: 1 year. You will receive a reminder call or letter in the mail in about 10 months reminding you to call and schedule your appointment. If you don't receive this letter, please contact our office.  Any Other Special Instructions Will Be Listed Below (If Applicable).  If you need a refill on your cardiac medications before your next appointment, please call your pharmacy.

## 2021-01-06 NOTE — Progress Notes (Signed)
Cardiology Office Note  Date: 01/06/2021   ID: Calvin, Fernandez [birth year], MRN [id number]  PCP:  Benita Stabile, MD  Cardiologist:  Dina Rich, MD Electrophysiologist:  None   Chief Complaint: Elevated blood pressure  History of Present Illness: Calvin Fernandez is a 65 y.o. male with a history of chest pain, GERD, hypertension.  He was last seen by Dr. Wyline Mood on 11/01/2019.  During the prior visit his HCTZ was stopped due to frequent urination.  Norvasc was increased to 10 mg daily.  This made him jittery.  He restarted his HCTZ at 12.5 mg and lowered his Norvasc to 2.5 mg.  He was compliant with his medications and home blood pressures 130s over 80s.  He had no recent palpitations on low-dose beta-blocker.  He is here today for 1 year follow-up.  He denies any recent acute illnesses or hospitalizations.  Denies any anginal or exertional symptoms, orthostatic symptoms, CVA or TIA-like symptoms, palpitations or arrhythmias, PND, orthopnea, bleeding issues, claudication-like symptoms, DVT or PE-like symptoms.  Blood pressure is 146/100 today.  However patient states at home his blood pressures usually range in the 140s over low 80s.  He has had some issues with side effects secondary to his antihypertensive medications.  He had previously been on amlodipine 5 mg daily but this caused jitteriness.  He eventually decreased amlodipine to 2.5 mg daily and restarted hydrochlorothiazide at 12.5 mg daily.  He continues on metoprolol 12.5 mg p.o. twice daily.  He states he has been having some issues with ED and is wondering if some of the medication may be attributing.  Past Medical History:  Diagnosis Date   Chest pain 2011   mild aortic plaque on CT of abdomen; negative stress echocardiogram in 09/2011   GERD (gastroesophageal reflux disease)    Hypertension    multiple drug intolerances; noncompliance   Wears glasses     Past Surgical History:  Procedure Laterality Date    EXTENSOR TENDON OF FOREARM / WRIST REPAIR  1990   left   HIP PINNING,CANNULATED Right 11/12/2014   Procedure: PERCUTANEOUS PINNING RIGHT HIP ;  Surgeon: Gean Birchwood, MD;  Location: [facility];  Service: Orthopedics;  Laterality: Right;    Current Outpatient Medications  Medication Sig Dispense Refill   amLODipine (NORVASC) 2.5 MG tablet Take 1 tablet (2.5 mg total) by mouth daily. TAKE 1 TABLET(2.5 MG) BY MOUTH DAILY 90 tablet 3   hydrochlorothiazide (HYDRODIURIL) 12.5 MG tablet Take 1 tablet (12.5 mg total) by mouth daily. 90 tablet 3   metoprolol tartrate (LOPRESSOR) 25 MG tablet Take 0.5 tablets (12.5 mg total) by mouth 2 (two) times daily. 90 tablet 3   No current facility-administered medications for this visit.   Allergies:  Tetanus toxoid   Social History: The patient  reports that he has never smoked. He has never used smokeless tobacco. He reports that he does not drink alcohol and does not use drugs.   Family History: The patient's family history includes Heart attack (age of onset: 37) in his father.   ROS:  Please see the history of present illness. Otherwise, complete review of systems is positive for none.  All other systems are reviewed and negative.   Physical Exam: VS:  BP (!) 146/100   Pulse 64   Ht 6\' 2"  (1.88 m)   Wt 217 lb 6.4 oz (98.6 kg)   SpO2 98%   BMI 27.91 kg/m , BMI Body mass index is 27.91  kg/m.  Wt Readings from Last 3 Encounters:  01/06/21 217 lb 6.4 oz (98.6 kg)  11/01/19 203 lb (92.1 kg)  11/03/18 208 lb (94.3 kg)    General: Patient appears comfortable at rest. HEENT: Conjunctiva and lids normal, oropharynx clear with moist mucosa. Neck: Supple, no elevated JVP or carotid bruits, no thyromegaly. Lungs: Clear to auscultation, nonlabored breathing at rest. Cardiac: Regular rate and rhythm, no S3 or significant systolic murmur, no pericardial rub. Abdomen: Soft, nontender, no hepatomegaly, bowel sounds present, no guarding or  rebound. Extremities: No pitting edema, distal pulses 2+. Skin: Warm and dry. Musculoskeletal: No kyphosis. Neuropsychiatric: Alert and oriented x3, affect grossly appropriate.  ECG: 01/06/2021 sinus rhythm with occasional premature ventricular complexes rate of 63.  Recent Labwork: No results found for requested labs within last 8760 hours.     Component Value Date/Time   CHOL 174 11/15/2017 0854   TRIG 93 11/15/2017 0854   HDL 50 11/15/2017 0854   CHOLHDL 3.5 11/15/2017 0854   CHOLHDL 3.6 11/21/2013 0704   VLDL 17 11/21/2013 0704   LDLCALC 105 (H) 11/15/2017 0854    Other Studies Reviewed Today:   09/2011 Stress Echo Study Conclusions  - Stress ECG conclusions: The stress ECG was negative for   ischemia. - Staged echo: There was no echocardiographic evidence for   stress-induced ischemia. Bruce protocol. Stress echocardiography.  Height:  Height: 188cm. Height: 74in.  Weight:  Weight: 104.1kg. Weight: 229lb.  Body mass index:  BMI: 29.5kg/m^2.  Body surface area:    BSA: 2.59m^2.  Blood pressure:     162/90.  Patient status:  Outpatient.  Assessment and Plan:  1. Essential hypertension   2. Palpitations   3. Encounter for prostate cancer screening   4. Encounter for screening for lipid disorder   5. Medication management    1. Essential hypertension Blood pressure elevated today on arrival at 146/100.  He states usually at home his blood pressure is around 145 over low 80s.  He had some issues with tolerating higher doses of amlodipine.  Initially was on amlodipine 5 mg but it caused jitteriness.  He eventually decreased amlodipine to 2.5 mg daily and restarted HCTZ at 12.5 mg daily.  He continues on metoprolol 12.5 mg p.o. twice daily.  He complains of some mild ED symptoms recently.  He would like follow-up lab work done here.  Patient states he has had not had lab work in quite some time.  Please get a complete metabolic panel and lipid panel.  2.  Palpitations Denies any recent palpitations or arrhythmias.  Continue metoprolol 12.5 mg p.o. twice daily.  EKG today shows sinus rhythm with occasional PVC rate of 63.  3.  BPH/screening for prostate cancer Patient states he has been having some issues with BPH with elevated PSAs.  He would like PSA drawn along with his other lab work today.  4.  Screening for hyperlipidemia Please get a lipid panel to screen for hyperlipidemia.  Medication Adjustments/Labs and Tests Ordered: Current medicines are reviewed at length with the patient today.  Concerns regarding medicines are outlined above.   Disposition: Follow-up with Dr. Wyline Mood or APP 1 year  Signed, Rennis Harding, NP 01/06/2021 11:53 AM    [facility] at [facility] [address] [place], [place], [place] [address] Phone: [phone]; Fax: [phone]

## 2021-01-08 DIAGNOSIS — Z13220 Encounter for screening for lipoid disorders: Secondary | ICD-10-CM | POA: Diagnosis not present

## 2021-01-08 DIAGNOSIS — R972 Elevated prostate specific antigen [PSA]: Secondary | ICD-10-CM | POA: Diagnosis not present

## 2021-01-08 DIAGNOSIS — Z79899 Other long term (current) drug therapy: Secondary | ICD-10-CM | POA: Diagnosis not present

## 2021-01-08 DIAGNOSIS — I10 Essential (primary) hypertension: Secondary | ICD-10-CM | POA: Diagnosis not present

## 2021-01-09 LAB — LIPID PANEL
Chol/HDL Ratio: 4 ratio (ref 0.0–5.0)
Cholesterol, Total: 182 mg/dL (ref 100–199)
HDL: 45 mg/dL (ref >39)
LDL Chol Calc (NIH): 122 mg/dL — ABNORMAL HIGH (ref 0–99)
Triglycerides: 78 mg/dL (ref 0–149)
VLDL Cholesterol Cal: 15 mg/dL (ref 5–40)

## 2021-01-09 LAB — COMPREHENSIVE METABOLIC PANEL
ALT: 14 IU/L (ref 0–44)
AST: 18 IU/L (ref 0–40)
Albumin/Globulin Ratio: 1.7 (ref 1.2–2.2)
Albumin: 4.3 g/dL (ref 3.8–4.8)
Alkaline Phosphatase: 53 IU/L (ref 44–121)
BUN/Creatinine Ratio: 15 (ref 10–24)
BUN: 18 mg/dL (ref 8–27)
Bilirubin Total: 0.7 mg/dL (ref 0.0–1.2)
CO2: 25 mmol/L (ref 20–29)
Calcium: 9.7 mg/dL (ref 8.6–10.2)
Chloride: 105 mmol/L (ref 96–106)
Creatinine, Ser: 1.23 mg/dL (ref 0.76–1.27)
Globulin, Total: 2.6 g/dL (ref 1.5–4.5)
Glucose: 87 mg/dL (ref 65–99)
Potassium: 4.7 mmol/L (ref 3.5–5.2)
Sodium: 143 mmol/L (ref 134–144)
Total Protein: 6.9 g/dL (ref 6.0–8.5)
eGFR: 65 mL/min/{1.73_m2} (ref >59)

## 2021-01-09 LAB — PSA: Prostate Specific Ag, Serum: 2.8 ng/mL (ref 0.0–4.0)

## 2021-01-09 LAB — MAGNESIUM: Magnesium: 2.3 mg/dL (ref 1.6–2.3)

## 2021-01-24 ENCOUNTER — Telehealth: Payer: Self-pay | Admitting: *Deleted

## 2021-01-24 NOTE — Telephone Encounter (Signed)
Patient informed and verbalized understanding of plan but does not want to statin drug at this time and says he'd rather try diet and exercise. Copy sent to PCP

## 2021-01-24 NOTE — Telephone Encounter (Signed)
-----   Message from Eustace Moore, RN sent at 01/10/2021  7:25 AM EDT -----  ----- Message ----- From: Netta Neat., NP Sent: 01/09/2021   7:57 AM EDT To: Lesle Chris, LPN  Please call the patient and let him know all of his lab work looks.  Except his bad cholesterol was a little high at 122.  The goal of LDL for those who do not have heart disease is less than 100.  We could place him on a low-dose statin or he can try lifestyle changes initially if he prefers.  If he would like to try a statin medication.  Start him on Lipitor 10 mg daily and check his fasting lipid panel and LFTs in 6 to 8 weeks to see if it improves.  PSA was normal.  Thanks. Netta Neat, NP  01/09/2021 7:55 AM

## 2021-10-14 DIAGNOSIS — H2513 Age-related nuclear cataract, bilateral: Secondary | ICD-10-CM | POA: Diagnosis not present

## 2021-10-14 DIAGNOSIS — H5213 Myopia, bilateral: Secondary | ICD-10-CM | POA: Diagnosis not present

## 2021-10-29 DIAGNOSIS — H2512 Age-related nuclear cataract, left eye: Secondary | ICD-10-CM | POA: Diagnosis not present

## 2021-10-29 DIAGNOSIS — H25812 Combined forms of age-related cataract, left eye: Secondary | ICD-10-CM | POA: Diagnosis not present

## 2021-12-17 DIAGNOSIS — H269 Unspecified cataract: Secondary | ICD-10-CM | POA: Diagnosis not present

## 2021-12-17 DIAGNOSIS — H2511 Age-related nuclear cataract, right eye: Secondary | ICD-10-CM | POA: Diagnosis not present

## 2022-01-02 ENCOUNTER — Other Ambulatory Visit: Payer: Self-pay

## 2022-01-02 MED ORDER — HYDROCHLOROTHIAZIDE 12.5 MG PO TABS: 12.5000 mg | ORAL_TABLET | Freq: Every day | ORAL | 0 refills | Status: DC

## 2022-01-02 MED ORDER — AMLODIPINE BESYLATE 2.5 MG PO TABS: 2.5000 mg | ORAL_TABLET | Freq: Every day | ORAL | 0 refills | Status: DC

## 2022-03-30 ENCOUNTER — Other Ambulatory Visit: Payer: Self-pay | Admitting: Cardiology

## 2022-04-07 NOTE — Progress Notes (Unsigned)
Cardiology Office Note   Date:  04/08/2022   ID:  Calvin Fernandez, DOB 02-Dec-1955, MRN [id number]  PCP:  Celene Squibb, MD  Cardiologist:  Dr. Harl Bowie    Chief Complaint  Patient presents with   Hypertension   Chest Pain    Hx of chest pain      History of Present Illness: Calvin Fernandez is a 66 y.o. male who presents for hx of chest pain, GERD and HTN.  During one of the prior visit his HCTZ was stopped due to frequent urination.  Norvasc was increased to 10 mg daily.  This made him jittery.  He restarted his HCTZ at 12.5 mg and lowered his Norvasc to 2.5 mg.  He was compliant with his medications and home blood pressures 130s over 80s.  He had no recent palpitations on low-dose beta-blocker.  Las visit BP 146/100.   Last lipids 12/2020 with Tchol 182, TG 78 HDL 45 and LDL 122  last Cr 1.23.    Echo stress many years ago was neg for ischemia Last OV 01/06/21.    Today pt has no chest pain or SOB.  He is active, chopping wood and walks 4-5 miles 3-4 times per week he tries to eat healthy.  His BP is usually controlled with current dosing of meds.  Last LDL 122   Past Medical History:  Diagnosis Date   Chest pain 2011   mild aortic plaque on CT of abdomen; negative stress echocardiogram in 09/2011   GERD (gastroesophageal reflux disease)    Hypertension    multiple drug intolerances; noncompliance   Wears glasses     Past Surgical History:  Procedure Laterality Date   EXTENSOR TENDON OF FOREARM / WRIST REPAIR  1990   left   HIP PINNING,CANNULATED Right 11/12/2014   Procedure: PERCUTANEOUS PINNING RIGHT HIP ;  Surgeon: Frederik Pear, MD;  Location: [facility];  Service: Orthopedics;  Laterality: Right;     Current Outpatient Medications  Medication Sig Dispense Refill   amLODipine (NORVASC) 2.5 MG tablet TAKE 1 TABLET(2.5 MG) BY MOUTH DAILY 90 tablet 3   hydrochlorothiazide (HYDRODIURIL) 12.5 MG tablet TAKE 1 TABLET(12.5 MG) BY MOUTH DAILY 90 tablet 3    metoprolol tartrate (LOPRESSOR) 25 MG tablet Take 0.5 tablets (12.5 mg total) by mouth 2 (two) times daily. 90 tablet 3   No current facility-administered medications for this visit.    Allergies:   Tetanus toxoid    Social History:  The patient  reports that he has never smoked. He has never used smokeless tobacco. He reports that he does not drink alcohol and does not use drugs.   Family History:  The patient's family history includes Heart attack (age of onset: 25) in his father.    ROS:  General:no colds or fevers, no weight changes Skin:no rashes or ulcers HEENT:no blurred vision, no congestion CV:see HPI PUL:see HPI GI:no diarrhea constipation or melena, no indigestion GU:no hematuria, no dysuria MS:no joint pain, no claudication Neuro:no syncope, no lightheadedness Endo:no diabetes, no thyroid disease  Wt Readings from Last 3 Encounters:  04/08/22 210 lb (95.3 kg)  01/06/21 217 lb 6.4 oz (98.6 kg)  11/01/19 203 lb (92.1 kg)     PHYSICAL EXAM: VS:  BP 138/80   Pulse 69   Ht 6\' 2"  (1.88 m)   Wt 210 lb (95.3 kg)   SpO2 94%   BMI 26.96 kg/m  , BMI Body mass index is 26.96 kg/m. General:Pleasant  affect, NAD Skin:Warm and dry, brisk capillary refill HEENT:normocephalic, sclera clear, mucus membranes moist Neck:supple, no JVD, no bruits  Heart:S1S2 RRR without murmur, gallup, rub or click Lungs:clear without rales, rhonchi, or wheezes AUQ:JFHL, non tender, + BS, do not palpate liver spleen or masses Ext:no lower ext edema, 2+ pedal pulses, 2+ radial pulses Neuro:alert and oriented, MAE, follows commands, + facial symmetry    EKG:  EKG is ordered today. The ekg ordered today demonstrates    Recent Labs: No results found for requested labs within last 365 days.    Lipid Panel    Component Value Date/Time   CHOL 182 01/08/2021 0821   TRIG 78 01/08/2021 0821   HDL 45 01/08/2021 0821   CHOLHDL 4.0 01/08/2021 0821   CHOLHDL 3.6 11/21/2013 0704   VLDL 17  11/21/2013 0704   LDLCALC 122 (H) 01/08/2021 0821       Other studies Reviewed: Additional studies/ records that were reviewed today include: SR with mild LVH no changes from 2022.  Marland Kitchen  Echo stress test 2013  Study Conclusions   - Stress ECG conclusions: The stress ECG was negative for    ischemia.  - Staged echo: There was no echocardiographic evidence for    stress-induced ischemia.  Baseline:   - LV size was normal.  - LV global systolic function was normal. The estimated LV    ejection fraction was 60% to 65%.  - Normal wall motion; no LV regional wall motion    abnormalities.  Peak stress:   - LV size was reduced and appropriately decreased from the    prior stage.  - LV global systolic function was hyperdynamic. The    estimated LV ejection fraction was 75% to 80%.  - No evidence for new LV regional wall motion abnormalities  ASSESSMENT AND PLAN:  1.  Hx of chest pain with neg echo stress.  No further pain. Is active walking up 4-5 miles 3-4 times per week and chopping wood without pain.  Continue active lifestyle.  Follow up in 1 year with Dr. Wyline Mood or earlier if issues occur.   2.  HTN stable, controlled on current meds no changes  3.  Screening for HLD with premature CAD in his father with MI in his 57s.      Current medicines are reviewed with the patient today.  The patient Has no concerns regarding medicines.  The following changes have been made:  See above Labs/ tests ordered today include:see above  Disposition:   FU:  see above  Signed, Nada Boozer, NP  04/08/2022 1:52 PM    [facility] [address] [place], [place], [place]  [address]/ [address] [employer] [address] [place], [place] Phone: [phone]; Fax: [phone]  [phone]

## 2022-04-08 ENCOUNTER — Encounter: Payer: Self-pay | Admitting: Cardiology

## 2022-04-08 ENCOUNTER — Ambulatory Visit: Payer: Medicare PPO | Attending: Cardiology | Admitting: Cardiology

## 2022-04-08 VITALS — BP 138/80 | HR 69 | Ht 74.0 in | Wt 210.0 lb

## 2022-04-08 DIAGNOSIS — Z87898 Personal history of other specified conditions: Secondary | ICD-10-CM

## 2022-04-08 DIAGNOSIS — I10 Essential (primary) hypertension: Secondary | ICD-10-CM

## 2022-04-08 DIAGNOSIS — Z79899 Other long term (current) drug therapy: Secondary | ICD-10-CM | POA: Diagnosis not present

## 2022-04-08 DIAGNOSIS — Z13220 Encounter for screening for lipoid disorders: Secondary | ICD-10-CM

## 2022-04-08 NOTE — Patient Instructions (Signed)
Medication Instructions:  Your physician recommends that you continue on your current medications as directed. Please refer to the Current Medication list given to you today.  *If you need a refill on your cardiac medications before your next appointment, please call your pharmacy*   Lab Work: Your physician recommends that you return for lab work Fasting. ( CMET, Lipid)   If you have labs (blood work) drawn today and your tests are completely normal, you will receive your results only by: MyChart Message (if you have [place]) OR A paper copy in the mail If you have any lab test that is abnormal or we need to change your treatment, we will call you to review the results.   Testing/Procedures: NONE    Follow-Up: At [facility], you and your health needs are our priority.  As part of our continuing mission to provide you with exceptional heart care, we have created designated Provider Care Teams.  These Care Teams include your primary Cardiologist (physician) and Advanced Practice Providers (APPs -  Physician Assistants and Nurse Practitioners) who all work together to provide you with the care you need, when you need it.  We recommend signing up for the patient portal called "MyChart".  Sign up information is provided on this After Visit Summary.  MyChart is used to connect with patients for Virtual Visits (Telemedicine).  Patients are able to view lab/test results, encounter notes, upcoming appointments, etc.  Non-urgent messages can be sent to your provider as well.   To learn more about what you can do with MyChart, go to [email or web address].    Your next appointment:   1 year(s)  The format for your next appointment:   In Person  Provider:   Carlyle Dolly, MD    Other Instructions Thank you for choosing [facility]!    Important Information About Sugar

## 2022-06-29 ENCOUNTER — Other Ambulatory Visit: Payer: Self-pay

## 2022-06-29 MED ORDER — METOPROLOL TARTRATE 25 MG PO TABS: 12.5000 mg | ORAL_TABLET | Freq: Two times a day (BID) | ORAL | 3 refills | Status: DC

## 2022-06-29 NOTE — Telephone Encounter (Signed)
Refilled lopressor 12.5 mg bid  to [facility]

## 2022-06-30 DIAGNOSIS — L578 Other skin changes due to chronic exposure to nonionizing radiation: Secondary | ICD-10-CM | POA: Diagnosis not present

## 2022-06-30 DIAGNOSIS — D485 Neoplasm of uncertain behavior of skin: Secondary | ICD-10-CM | POA: Diagnosis not present

## 2022-06-30 DIAGNOSIS — D225 Melanocytic nevi of trunk: Secondary | ICD-10-CM | POA: Diagnosis not present

## 2022-06-30 DIAGNOSIS — D2261 Melanocytic nevi of right upper limb, including shoulder: Secondary | ICD-10-CM | POA: Diagnosis not present

## 2022-07-20 DIAGNOSIS — Z961 Presence of intraocular lens: Secondary | ICD-10-CM | POA: Diagnosis not present

## 2022-11-19 DIAGNOSIS — H04123 Dry eye syndrome of bilateral lacrimal glands: Secondary | ICD-10-CM | POA: Diagnosis not present

## 2023-03-30 ENCOUNTER — Other Ambulatory Visit: Payer: Self-pay | Admitting: Cardiology

## 2023-04-12 ENCOUNTER — Encounter: Payer: Self-pay | Admitting: Cardiology

## 2023-04-12 ENCOUNTER — Ambulatory Visit: Payer: Medicare PPO | Attending: Cardiology | Admitting: Cardiology

## 2023-04-12 VITALS — BP 128/80 | HR 50 | Ht 74.0 in | Wt 215.8 lb

## 2023-04-12 DIAGNOSIS — I10 Essential (primary) hypertension: Secondary | ICD-10-CM

## 2023-04-12 DIAGNOSIS — R002 Palpitations: Secondary | ICD-10-CM | POA: Diagnosis not present

## 2023-04-12 DIAGNOSIS — Z131 Encounter for screening for diabetes mellitus: Secondary | ICD-10-CM | POA: Diagnosis not present

## 2023-04-12 DIAGNOSIS — Z13220 Encounter for screening for lipoid disorders: Secondary | ICD-10-CM

## 2023-04-12 MED ORDER — SILDENAFIL CITRATE 20 MG PO TABS: ORAL_TABLET | ORAL | 2 refills | Status: DC

## 2023-04-12 NOTE — Progress Notes (Signed)
Clinical Summary Mr. Vossler is a 67 y.o.maleseen today for follow up of the following medical problems.      1. HTN - last visit we stopped his HCTZ due to frequent urination, prior to that he tried lowering the dose without any improvement. Norvasc was increased to 10mg  daily.  - this change made him feel "jittery". he ended up restarting his HCTZ 12.5 and lowering his norvasc back to 2.5mg  daily.    - home bp's  120s-130s/80s    2. History of Palpitations  - denies any palpitations - compliant with lopressor   3. Erectile dysfunction - progressive over last few years, has never tried sildenafil    SH   His mother in law Manus Rudd is also a patient at our practice His wife Demon Volante also is a patient.    Past Medical History:  Diagnosis Date   Chest pain 2011   mild aortic plaque on CT of abdomen; negative stress echocardiogram in 09/2011   GERD (gastroesophageal reflux disease)    Hypertension    multiple drug intolerances; noncompliance   Wears glasses      Allergies  Allergen Reactions   Tetanus Toxoid Rash     Current Outpatient Medications  Medication Sig Dispense Refill   amLODipine (NORVASC) 2.5 MG tablet TAKE 1 TABLET(2.5 MG) BY MOUTH DAILY 90 tablet 3   hydrochlorothiazide (HYDRODIURIL) 12.5 MG tablet TAKE 1 TABLET(12.5 MG) BY MOUTH DAILY 90 tablet 3   metoprolol tartrate (LOPRESSOR) 25 MG tablet Take 0.5 tablets (12.5 mg total) by mouth 2 (two) times daily. 90 tablet 3   No current facility-administered medications for this visit.     Past Surgical History:  Procedure Laterality Date   EXTENSOR TENDON OF FOREARM / WRIST REPAIR  1990   left   HIP PINNING,CANNULATED Right 11/12/2014   Procedure: PERCUTANEOUS PINNING RIGHT HIP ;  Surgeon: Gean Birchwood, MD;  Location: [facility];  Service: Orthopedics;  Laterality: Right;     Allergies  Allergen Reactions   Tetanus Toxoid Rash      Family History  Problem  Relation Age of Onset   Heart attack Father 45     Social History Mr. Sgro reports that he has never smoked. He has never used smokeless tobacco. Mr. Arts reports no history of alcohol use.   Review of Systems CONSTITUTIONAL: No weight loss, fever, chills, weakness or fatigue.  HEENT: Eyes: No visual loss, blurred vision, double vision or yellow sclerae.No hearing loss, sneezing, congestion, runny nose or sore throat.  SKIN: No rash or itching.  CARDIOVASCULAR: per hpi RESPIRATORY: No shortness of breath, cough or sputum.  GASTROINTESTINAL: No anorexia, nausea, vomiting or diarrhea. No abdominal pain or blood.  GENITOURINARY: No burning on urination, no polyuria NEUROLOGICAL: No headache, dizziness, syncope, paralysis, ataxia, numbness or tingling in the extremities. No change in bowel or bladder control.  MUSCULOSKELETAL: No muscle, back pain, joint pain or stiffness.  LYMPHATICS: No enlarged nodes. No history of splenectomy.  PSYCHIATRIC: No history of depression or anxiety.  ENDOCRINOLOGIC: No reports of sweating, cold or heat intolerance. No polyuria or polydipsia.  Marland Kitchen   Physical Examination Today's Vitals   04/12/23 0812  BP: 128/80  Pulse: (!) 50  SpO2: 95%  Weight: 215 lb 12.8 oz (97.9 kg)  Height: 6\' 2"  (1.88 m)   Body mass index is 27.71 kg/m.  Gen: resting comfortably, no acute distress HEENT: no scleral icterus, pupils equal round and reactive, no  palptable cervical adenopathy,  CV: RRR, no m/r,g no jvd Resp: Clear to auscultation bilaterally GI: abdomen is soft, non-tender, non-distended, normal bowel sounds, no hepatosplenomegaly MSK: extremities are warm, no edema.  Skin: warm, no rash Neuro:  no focal deficits Psych: appropriate affect   Diagnostic Studies     Assessment and Plan   1. HTN - did not tolerate higher doses of norvasc or HCTZ, on low dose beta blocker for palpitations - bp is at goal, continue current meds   2.  Palpitations -no recent symptoms, has done well on low dose lopressor - EKG today shows NSR at 70 - continue current meds   F/u 1 year. Obtain annual labs.     Antoine Poche, M.D.

## 2023-04-12 NOTE — Patient Instructions (Signed)
Medication Instructions:  Your physician has recommended you make the following change in your medication:   -Start Sildenofil 20 mg tablets- take 3-5 tablets 30 minutes prior to intercourse.   *If you need a refill on your cardiac medications before your next appointment, please call your pharmacy*   Lab Work: CMET TSH Lipid A1C MAG CBC  If you have labs (blood work) drawn today and your tests are completely normal, you will receive your results only by: MyChart Message (if you have MyChart) OR A paper copy in the mail If you have any lab test that is abnormal or we need to change your treatment, we will call you to review the results.   Testing/Procedures: None   Follow-Up: At [facility], you and your health needs are our priority.  As part of our continuing mission to provide you with exceptional heart care, we have created designated Provider Care Teams.  These Care Teams include your primary Cardiologist (physician) and Advanced Practice Providers (APPs -  Physician Assistants and Nurse Practitioners) who all work together to provide you with the care you need, when you need it.  We recommend signing up for the patient portal called "MyChart".  Sign up information is provided on this After Visit Summary.  MyChart is used to connect with patients for Virtual Visits (Telemedicine).  Patients are able to view lab/test results, encounter notes, upcoming appointments, etc.  Non-urgent messages can be sent to your provider as well.   To learn more about what you can do with MyChart, go to [email or web address].    Your next appointment:   1 year(s)  Provider:   You may see Dina Rich, MD or one of the following Advanced Practice Providers on your designated Care Team:   Randall An, PA-C  Jacolyn Reedy, [place]     Other Instructions

## 2023-05-14 DIAGNOSIS — I10 Essential (primary) hypertension: Secondary | ICD-10-CM | POA: Diagnosis not present

## 2023-05-14 DIAGNOSIS — Z13220 Encounter for screening for lipoid disorders: Secondary | ICD-10-CM | POA: Diagnosis not present

## 2023-05-14 DIAGNOSIS — Z131 Encounter for screening for diabetes mellitus: Secondary | ICD-10-CM | POA: Diagnosis not present

## 2023-05-15 LAB — MAGNESIUM: Magnesium: 2.2 mg/dL (ref 1.6–2.3)

## 2023-05-15 LAB — COMPREHENSIVE METABOLIC PANEL
ALT: 19 [IU]/L (ref 0–44)
AST: 19 [IU]/L (ref 0–40)
Albumin: 4.2 g/dL (ref 3.9–4.9)
Alkaline Phosphatase: 54 [IU]/L (ref 44–121)
BUN/Creatinine Ratio: 13 (ref 10–24)
BUN: 14 mg/dL (ref 8–27)
Bilirubin Total: 0.6 mg/dL (ref 0.0–1.2)
CO2: 25 mmol/L (ref 20–29)
Calcium: 9.5 mg/dL (ref 8.6–10.2)
Chloride: 102 mmol/L (ref 96–106)
Creatinine, Ser: 1.06 mg/dL (ref 0.76–1.27)
Globulin, Total: 2.6 g/dL (ref 1.5–4.5)
Glucose: 92 mg/dL (ref 70–99)
Potassium: 4.5 mmol/L (ref 3.5–5.2)
Sodium: 141 mmol/L (ref 134–144)
Total Protein: 6.8 g/dL (ref 6.0–8.5)
eGFR: 76 mL/min/{1.73_m2} (ref >59)

## 2023-05-15 LAB — CBC
Hematocrit: 48.2 % (ref 37.5–51.0)
Hemoglobin: 16.3 g/dL (ref 13.0–17.7)
MCH: 31.6 pg (ref 26.6–33.0)
MCHC: 33.8 g/dL (ref 31.5–35.7)
MCV: 93 fL (ref 79–97)
Platelets: 274 10*3/uL (ref 150–450)
RBC: 5.16 x10E6/uL (ref 4.14–5.80)
RDW: 12.2 % (ref 11.6–15.4)
WBC: 7.2 10*3/uL (ref 3.4–10.8)

## 2023-05-15 LAB — LIPID PANEL
Chol/HDL Ratio: 4.5 {ratio} (ref 0.0–5.0)
Cholesterol, Total: 180 mg/dL (ref 100–199)
HDL: 40 mg/dL (ref >39)
LDL Chol Calc (NIH): 122 mg/dL — ABNORMAL HIGH (ref 0–99)
Triglycerides: 96 mg/dL (ref 0–149)
VLDL Cholesterol Cal: 18 mg/dL (ref 5–40)

## 2023-05-15 LAB — HEMOGLOBIN A1C
Est. average glucose Bld gHb Est-mCnc: 108 mg/dL
Hgb A1c MFr Bld: 5.4 % (ref 4.8–5.6)

## 2023-05-15 LAB — TSH: TSH: 2.95 u[IU]/mL (ref 0.450–4.500)

## 2023-06-05 ENCOUNTER — Telehealth: Payer: Self-pay | Admitting: Cardiology

## 2023-06-05 NOTE — Telephone Encounter (Signed)
Patient called into the answering service concerned about his blood pressure.  He has had a cold for the last several days and has been taking an antihistamine with decongestant and had noted an increase in his blood pressure.  He stated that Dr. Wyline Mood has been caring for him and his blood pressure for approximately the past 10 years and he had never seen a blood pressure as high as the 166/105 that he had done at home.  He was nervous last evening and doubled up on all of his medications so he took 2 doses of HCTZ 12.5 mg, amlodipine 2.5 mg, and metoprolol 12.5 mg.  He was advised that it was likely the decongestant and his cold medication that was elevating his blood pressure.  He was encouraged that it was perfectly safe for him to take his blood pressure medication in the morning and in the p.m. and continue to monitor his pressures 1 to 2 hours postmedication administration.  He has also been advised to avoid antihistamines with a decongestion in them to avoid continued elevations in his blood pressure.  Will send follow-up to Dr. Verna Czech office for them to make contact with him early in the week to see how his blood pressure is and if he needs to be seen.  He has also been advised on emergency department precautions as well.

## 2023-06-07 ENCOUNTER — Telehealth: Payer: Self-pay

## 2023-06-07 NOTE — Telephone Encounter (Signed)
-----   Message from [facility] sent at 06/05/2023 10:26 AM EST ----- Patient called into the answering service over the weekend with concerns of elevated blood pressure and had been taken medication for head cold but had decongestion.  Blood pressures were running 160s over 100s and he was advised it was safe for him to take an additional dose of his HCTZ, amlodipine, and metoprolol.  He has been advised not to take any further decongestant but just plain antihistamines and continue to monitor his blood pressures 1 to 2 hours postmedication administration.  Did advise him that we do have someone from the clinic call and check on him on Monday to determine if he needed to be seen in clinic at that time.  Thanks, [employer]

## 2023-06-07 NOTE — Telephone Encounter (Signed)
I spoke with patient and he says she has stopped OTC cold medications a few days ago. He states his BP is around 125/80. He was not watching his sodium intake but is now. I suggested he can use plain Allegra,Mucinex or Claritin and tylenol was good for headache. He says he had doubled his amlodipine and hydrochlorothiazide dose for about 4 days but is now back to daily dosing. He will continue to monitor bp and let us know if BP goes up again.

## 2023-06-14 NOTE — Telephone Encounter (Signed)
Patient verbalized understanding and had no other questions or complaints at this time. Pt will schedule an appointment with PCP's office.

## 2023-06-14 NOTE — Telephone Encounter (Signed)
Pt came into the office today and stated that everything has been fine with his BP until last night. He said he broke out in bumps between his legs and was throwing up, sick to his stomach and chest hurting and did not sleep at all. He thinks this is the medication change and would like to know what he should do about this. [phone] is the best number to reach the pt.

## 2023-06-14 NOTE — Telephone Encounter (Signed)
"  He said he broke out in bumps between his legs and was throwing up, sick to his stomach and chest hurting and did not sleep at all"  Needs to see pcp for these symptoms. If high blood pressure in this setting its in response to something else causing all the other symptoms, not a primary bp issue itself.   Dominga Ferry MD

## 2023-06-16 DIAGNOSIS — Z6830 Body mass index (BMI) 30.0-30.9, adult: Secondary | ICD-10-CM | POA: Diagnosis not present

## 2023-06-16 DIAGNOSIS — R7301 Impaired fasting glucose: Secondary | ICD-10-CM | POA: Diagnosis not present

## 2023-06-16 DIAGNOSIS — Z125 Encounter for screening for malignant neoplasm of prostate: Secondary | ICD-10-CM | POA: Diagnosis not present

## 2023-06-16 DIAGNOSIS — R972 Elevated prostate specific antigen [PSA]: Secondary | ICD-10-CM | POA: Diagnosis not present

## 2023-06-16 DIAGNOSIS — I10 Essential (primary) hypertension: Secondary | ICD-10-CM | POA: Diagnosis not present

## 2023-06-16 DIAGNOSIS — E669 Obesity, unspecified: Secondary | ICD-10-CM | POA: Diagnosis not present

## 2023-06-16 DIAGNOSIS — Z7689 Persons encountering health services in other specified circumstances: Secondary | ICD-10-CM | POA: Diagnosis not present

## 2023-06-16 DIAGNOSIS — R112 Nausea with vomiting, unspecified: Secondary | ICD-10-CM | POA: Diagnosis not present

## 2023-06-16 DIAGNOSIS — Z79899 Other long term (current) drug therapy: Secondary | ICD-10-CM | POA: Diagnosis not present

## 2023-06-18 ENCOUNTER — Emergency Department (HOSPITAL_COMMUNITY)
Admission: EM | Admit: 2023-06-18 | Discharge: 2023-06-19 | Disposition: A | Discharge status: Home / Self Care | Payer: Medicare PPO | Attending: Emergency Medicine | Admitting: Emergency Medicine

## 2023-06-18 ENCOUNTER — Other Ambulatory Visit: Payer: Self-pay

## 2023-06-18 ENCOUNTER — Emergency Department (HOSPITAL_COMMUNITY): Payer: Medicare PPO

## 2023-06-18 ENCOUNTER — Encounter (HOSPITAL_COMMUNITY): Payer: Self-pay

## 2023-06-18 DIAGNOSIS — R079 Chest pain, unspecified: Secondary | ICD-10-CM

## 2023-06-18 DIAGNOSIS — Z7901 Long term (current) use of anticoagulants: Secondary | ICD-10-CM | POA: Insufficient documentation

## 2023-06-18 DIAGNOSIS — R0789 Other chest pain: Secondary | ICD-10-CM | POA: Diagnosis not present

## 2023-06-18 DIAGNOSIS — I480 Paroxysmal atrial fibrillation: Secondary | ICD-10-CM | POA: Diagnosis not present

## 2023-06-18 DIAGNOSIS — R7309 Other abnormal glucose: Secondary | ICD-10-CM | POA: Insufficient documentation

## 2023-06-18 DIAGNOSIS — Z79899 Other long term (current) drug therapy: Secondary | ICD-10-CM | POA: Diagnosis not present

## 2023-06-18 DIAGNOSIS — I10 Essential (primary) hypertension: Secondary | ICD-10-CM | POA: Diagnosis not present

## 2023-06-18 DIAGNOSIS — R739 Hyperglycemia, unspecified: Secondary | ICD-10-CM

## 2023-06-18 LAB — BASIC METABOLIC PANEL
Anion gap: 10 (ref 5–15)
BUN: 19 mg/dL (ref 8–23)
CO2: 24 mmol/L (ref 22–32)
Calcium: 8.7 mg/dL — ABNORMAL LOW (ref 8.9–10.3)
Chloride: 105 mmol/L (ref 98–111)
Creatinine, Ser: 1.09 mg/dL (ref 0.61–1.24)
GFR, Estimated: >60 mL/min (ref >60)
Glucose, Bld: 123 mg/dL — ABNORMAL HIGH (ref 70–99)
Potassium: 3.6 mmol/L (ref 3.5–5.1)
Sodium: 139 mmol/L (ref 135–145)

## 2023-06-18 LAB — CBC
HCT: 43.5 % (ref 39.0–52.0)
Hemoglobin: 14.7 g/dL (ref 13.0–17.0)
MCH: 31 pg (ref 26.0–34.0)
MCHC: 33.8 g/dL (ref 30.0–36.0)
MCV: 91.8 fL (ref 80.0–100.0)
Platelets: 245 10*3/uL (ref 150–400)
RBC: 4.74 MIL/uL (ref 4.22–5.81)
RDW: 12.3 % (ref 11.5–15.5)
WBC: 9.9 10*3/uL (ref 4.0–10.5)
nRBC: 0 % (ref 0.0–0.2)

## 2023-06-18 LAB — TROPONIN I (HIGH SENSITIVITY)
Troponin I (High Sensitivity): 8 ng/L (ref <18)
Troponin I (High Sensitivity): 8 ng/L (ref <18)

## 2023-06-18 NOTE — ED Provider Notes (Incomplete)
[place] EMERGENCY DEPARTMENT AT [facility] Provider Note   CSN: [id number] Arrival date & time: 06/18/23  2126     History {Add pertinent medical, surgical, social history, OB history to HPI:1} Chief Complaint  Patient presents with  . Chest Pain    Calvin Fernandez is a 67 y.o. male.  The history is provided by the patient.  Chest Pain He has history of hypertension   Home Medications Prior to Admission medications   Medication Sig Start Date End Date Taking? Authorizing Provider  amLODipine (NORVASC) 2.5 MG tablet TAKE 1 TABLET(2.5 MG) BY MOUTH DAILY 03/30/23   Antoine Poche, MD  hydrochlorothiazide (HYDRODIURIL) 12.5 MG tablet TAKE 1 TABLET(12.5 MG) BY MOUTH DAILY 03/30/23   Antoine Poche, MD  metoprolol tartrate (LOPRESSOR) 25 MG tablet Take 0.5 tablets (12.5 mg total) by mouth 2 (two) times daily. 06/29/22   Antoine Poche, MD  sildenafil (REVATIO) 20 MG tablet Take 3-5 tablets by mouth 30 minutes prior to intercourse. 04/12/23   Antoine Poche, MD      Allergies    Tetanus toxoid    Review of Systems   Review of Systems  Cardiovascular:  Positive for chest pain.  All other systems reviewed and are negative.   Physical Exam Updated Vital Signs BP (!) 143/78   Pulse 61   Temp 98 F (36.7 C) (Oral)   Resp 20   Ht 6\' 2"  (1.88 m)   Wt 96.2 kg   SpO2 93%   BMI 27.22 kg/m  Physical Exam Vitals and nursing note reviewed.   67 year old male, resting comfortably and in no acute distress. Vital signs are ***. Oxygen saturation is ***%, which is normal. Head is normocephalic and atraumatic. PERRLA, EOMI. Oropharynx is clear. Neck is nontender and supple without adenopathy or JVD. Back is nontender and there is no CVA tenderness. Lungs are clear without rales, wheezes, or rhonchi. Chest is nontender. Heart has regular rate and rhythm without murmur. Abdomen is soft, flat, nontender without masses or hepatosplenomegaly and peristalsis  is normoactive. Extremities have no cyanosis or edema, full range of motion is present. Skin is warm and dry without rash. Neurologic: Mental status is normal, cranial nerves are intact, there are no motor or sensory deficits.  ED Results / Procedures / Treatments   Labs (all labs ordered are listed, but only abnormal results are displayed) Labs Reviewed  BASIC METABOLIC PANEL - Abnormal; Notable for the following components:      Result Value   Glucose, Bld 123 (*)    Calcium 8.7 (*)    All other components within normal limits  CBC  TROPONIN I (HIGH SENSITIVITY)  TROPONIN I (HIGH SENSITIVITY)    EKG EKG Interpretation Date/Time:  Friday June 18 2023 22:16:36 EST Ventricular Rate:  67 PR Interval:  160 QRS Duration:  108 QT Interval:  416 QTC Calculation: 440 R Axis:   3  Text Interpretation: Sinus rhythm Ventricular premature complex Confirmed by Bethann Berkshire [phone]) on 06/18/2023 10:19:27 PM  Radiology DG Chest 2 View  Result Date: 06/18/2023 CLINICAL DATA:  Chest pain EXAM: CHEST - 2 VIEW COMPARISON:  03/13/2010 FINDINGS: The heart size and mediastinal contours are within normal limits. Both lungs are clear. The visualized skeletal structures are unremarkable. IMPRESSION: No active cardiopulmonary disease. Electronically Signed   By: Alcide Clever M.D.   On: 06/18/2023 21:58    Procedures Procedures  {Document cardiac monitor, telemetry assessment procedure when appropriate:1}  Medications Ordered in ED Medications - No data to display  ED Course/ Medical Decision Making/ A&P   {   Click here for ABCD2, HEART and other calculatorsREFRESH Note before signing :1}                              Medical Decision Making Amount and/or Complexity of Data Reviewed Labs: ordered. Radiology: ordered.   ***  {Document critical care time when appropriate:1} {Document review of labs and clinical decision tools ie heart score, Chads2Vasc2 etc:1}  {Document your  independent review of radiology images, and any outside records:1} {Document your discussion with family members, caretakers, and with consultants:1} {Document social determinants of health affecting pt's care:1} {Document your decision making why or why not admission, treatments were needed:1} Final Clinical Impression(s) / ED Diagnoses Final diagnoses:  None    Rx / DC Orders ED Discharge Orders     None

## 2023-06-18 NOTE — ED Triage Notes (Signed)
Pt reports:  Chest numbness Started about 30 minutes ago Believes it could be related to a medication Metoprolol 12.5mg 

## 2023-06-18 NOTE — ED Provider Notes (Signed)
[place] EMERGENCY DEPARTMENT AT [facility] Provider Note   CSN: [id number] Arrival date & time: 06/18/23  2126     History  Chief Complaint  Patient presents with   Chest Pain    Calvin Fernandez is a 67 y.o. male.  The history is provided by the patient.  Chest Pain He has history of hypertension and comes in because of an episode of tightness in his chest.  There is no associated dyspnea, nausea, diaphoresis.  He denies any palpitations.  He is a non-smoker and denies history of diabetes or hyperlipidemia.  There is a strong family history of coronary atherosclerosis, but not of premature coronary atherosclerosis.   Home Medications Prior to Admission medications   Medication Sig Start Date End Date Taking? Authorizing Provider  apixaban (ELIQUIS) 5 MG TABS tablet Take 1 tablet (5 mg total) by mouth 2 (two) times daily. 06/19/23  Yes Dione Booze, MD  amLODipine (NORVASC) 2.5 MG tablet TAKE 1 TABLET(2.5 MG) BY MOUTH DAILY 03/30/23   Antoine Poche, MD  hydrochlorothiazide (HYDRODIURIL) 12.5 MG tablet TAKE 1 TABLET(12.5 MG) BY MOUTH DAILY 03/30/23   Antoine Poche, MD  metoprolol tartrate (LOPRESSOR) 25 MG tablet Take 0.5 tablets (12.5 mg total) by mouth 2 (two) times daily. 06/29/22   Antoine Poche, MD  sildenafil (REVATIO) 20 MG tablet Take 3-5 tablets by mouth 30 minutes prior to intercourse. 04/12/23   Antoine Poche, MD      Allergies    Tetanus toxoid    Review of Systems   Review of Systems  Cardiovascular:  Positive for chest pain.  All other systems reviewed and are negative.   Physical Exam Updated Vital Signs BP (!) 142/95   Pulse 70   Temp 98 F (36.7 C) (Oral)   Resp 14   Ht 6\' 2"  (1.88 m)   Wt 96.2 kg   SpO2 98%   BMI 27.22 kg/m  Physical Exam Vitals and nursing note reviewed.   67 year old male, resting comfortably and in no acute distress. Vital signs are significant for mildly elevated blood pressure. Oxygen  saturation is 93%, which is normal. Head is normocephalic and atraumatic. PERRLA, EOMI. Oropharynx is clear. Neck is nontender and supple without adenopathy or JVD. Back is nontender and there is no CVA tenderness. Lungs are clear without rales, wheezes, or rhonchi. Chest is nontender. Heart is tachycardic and irregular without murmur. Abdomen is soft, flat, nontender. Extremities have no cyanosis or edema, full range of motion is present. Skin is warm and dry without rash. Neurologic: Mental status is normal, moves all extremities equally.  ED Results / Procedures / Treatments   Labs (all labs ordered are listed, but only abnormal results are displayed) Labs Reviewed  BASIC METABOLIC PANEL - Abnormal; Notable for the following components:      Result Value   Glucose, Bld 123 (*)    Calcium 8.7 (*)    All other components within normal limits  CBC  MAGNESIUM  TROPONIN I (HIGH SENSITIVITY)  TROPONIN I (HIGH SENSITIVITY)    EKG KRISTHIAN, BABAR [id number] 18-Jun-2023 21:33:17 [facility]-AP-ED ROUTINE RECORD [birth year] (41 yr) Male Caucasian Room:VT1 Loc:906 Technician: Test [id number] Pain Vent. rate 72 BPM PR interval 168 ms QRS duration 94 ms QT/QTcB 414/453 ms P-R-T axes 45 -1 21 Sinus rhythm with occasional Premature ventricular complexes Otherwise normal ECG When compared with ECG of 12-Apr-2023 08:18, Premature ventricular complexes are now Present Nonspecific T wave abnormality  has replaced inverted T waves in Inferior leads Confirmed by Dione Booze ([id number]) on 06/18/2023 11:58:09 PM Confirmed By: Dione Booze  JEVANTE, MCCAUSLIN [id number] 18-Jun-2023 22:16:36 [facility]-AP-ED ROUTINE RECORD 1956/06/18 (5 yr) Male Caucasian Room:ER12 Loc:906 Technician: jp Test ind: Comment 1:: Comment 2:: Comment 3:: Comment 4:: Vent. rate 67 BPM PR interval 160 ms QRS duration 108 ms QT/QTcB 416/440 ms P-R-T axes 13 3 15  Sinus  rhythm Ventricular premature complex When compared with ECG of EARLIER SAME DATE No significant change was found Reconfirmed by Dione Booze ([id number]) on 06/19/2023 12:12:17 AM Confirmed By: Dione Booze  EKG Interpretation Date/Time:  Saturday June 19 2023 00:31:46 EST Ventricular Rate:  81 PR Interval:  160 QRS Duration:  105 QT Interval:  385 QTC Calculation: 447 R Axis:   -9  Text Interpretation: duplicate, discard Confirmed by Dione Booze ([id number]) on 06/19/2023 12:37:38 AM   EKG Interpretation Date/Time:  Saturday June 19 2023 00:31:46 EST Ventricular Rate:  81 PR Interval:  160 QRS Duration:  105 QT Interval:  385 QTC Calculation: 447 R Axis:   -9  Text Interpretation: duplicate, discard Confirmed by Dione Booze ([id number]) on 06/19/2023 12:37:38 AM        Radiology DG Chest 2 View  Result Date: 06/18/2023 CLINICAL DATA:  Chest pain EXAM: CHEST - 2 VIEW COMPARISON:  03/13/2010 FINDINGS: The heart size and mediastinal contours are within normal limits. Both lungs are clear. The visualized skeletal structures are unremarkable. IMPRESSION: No active cardiopulmonary disease. Electronically Signed   By: Alcide Clever M.D.   On: 06/18/2023 21:58    Procedures .Cardioversion  Date/Time: 06/19/2023 12:35 AM  Performed by: Dione Booze, MD Authorized by: Dione Booze, MD   Consent:    Consent obtained:  Written   Consent given by:  Patient   Risks discussed:  Cutaneous burn, death, induced arrhythmia and pain   Alternatives discussed:  Rate-control medication Pre-procedure details:    Cardioversion basis:  Emergent   Rhythm:  Atrial fibrillation   Electrode placement:  Anterior-posterior Patient sedated: Yes. Refer to sedation procedure documentation for details of sedation.  Attempt one:    Cardioversion mode:  Synchronous   Waveform:  Biphasic   Shock (Joules):  120   Shock outcome:  Conversion to normal sinus rhythm Post-procedure details:    Patient status:   Awake   Patient tolerance of procedure:  Tolerated well, no immediate complications .Sedation  Date/Time: 06/19/2023 12:36 AM  Performed by: Dione Booze, MD Authorized by: Dione Booze, MD   Consent:    Consent obtained:  Verbal   Consent given by:  Patient   Risks discussed:  Allergic reaction, dysrhythmia, inadequate sedation, nausea, prolonged hypoxia resulting in organ damage, prolonged sedation necessitating reversal, respiratory compromise necessitating ventilatory assistance and intubation and vomiting   Alternatives discussed:  Analgesia without sedation, anxiolysis and regional anesthesia Universal protocol:    Procedure explained and questions answered to patient or proxy's satisfaction: yes     Relevant documents present and verified: yes     Test results available: yes     Imaging studies available: yes     Required blood products, implants, devices, and special equipment available: yes     Site/side marked: yes     Immediately prior to procedure, a time out was called: yes     Patient identity confirmed:  Verbally with patient and arm band Indications:    Procedure performed:  Cardioversion   Procedure necessitating sedation performed by:  Physician performing  sedation Pre-sedation assessment:    Time since last food or drink:  6 hours   ASA classification: class 1 - normal, healthy patient     Mouth opening:  3 or more finger widths   Thyromental distance:  4 finger widths   Mallampati score:  I - soft palate, uvula, fauces, pillars visible   Neck mobility: normal     Pre-sedation assessments completed and reviewed: airway patency, cardiovascular function, hydration status, mental status, nausea/vomiting, pain level, respiratory function and temperature   A pre-sedation assessment was completed prior to the start of the procedure Immediate pre-procedure details:    Reassessment: Patient reassessed immediately prior to procedure     Reviewed: vital signs, relevant  labs/tests and NPO status     Verified: bag valve mask available, emergency equipment available, intubation equipment available, IV patency confirmed, oxygen available and suction available   Procedure details (see MAR for exact dosages):    Preoxygenation:  Nasal cannula   Sedation:  Propofol   Intended level of sedation: deep   Intra-procedure monitoring:  Blood pressure monitoring, cardiac monitor, continuous pulse oximetry, frequent LOC assessments, frequent vital sign checks and continuous capnometry   Intra-procedure events: none     Total Provider sedation time (minutes):  30 Post-procedure details:   A post-sedation assessment was completed following the completion of the procedure.   Attendance: Constant attendance by certified staff until patient recovered     Recovery: Patient returned to pre-procedure baseline     Post-sedation assessments completed and reviewed: airway patency, cardiovascular function, hydration status, mental status, nausea/vomiting, pain level, respiratory function and temperature     Patient is stable for discharge or admission: yes     Procedure completion:  Tolerated well, no immediate complications   Cardiac monitor shows atrial fibrillation, per my interpretation.  Medications Ordered in ED Medications  apixaban (ELIQUIS) tablet 5 mg (has no administration in time range)  propofol (DIPRIVAN) 10 mg/mL bolus/IV push 48.1 mg (50 mg Intravenous Given 06/19/23 0029)    ED Course/ Medical Decision Making/ A&P         CHA2DS2-VASc Score: 2                        Medical Decision Making Amount and/or Complexity of Data Reviewed Labs: ordered. Radiology: ordered. ECG/medicine tests: ordered.  Risk Prescription drug management.   Chest pain concerning for ACS.  Monitor showing atrial fibrillation which may be precipitating ACS because of demand ischemia.  I have reviewed his electrocardiogram, and my interpretation is sinus rhythm with occasional  PVCs.  I have reviewed his repeat electrocardiogram and my interpretation is sinus rhythm with PVCs, unchanged from first ECG.  I ordered a 30 ECG because monitor showed atrial fibrillation.  My interpretation of this ECG is atrial fibrillation with rapid ventricular response and occasional PVC.  I have reviewed his laboratory tests, and my interpretation is elevated random glucose level, normal CBC, normal troponin x 2.  Chest x-ray shows no active cardiopulmonary disease.  I have independently viewed the images, and agree with the radiologist's interpretation.  I have reviewed his past records, and note office visit on 04/12/2023 with diagnosis of hypertension and palpitations managed with hydrochlorothiazide and metoprolol.  Because of new onset atrial fibrillation, I have initiated the atrial fibrillation pathway and I ordering cardioversion under procedural sedation.  Cardioversion was successful with patient going into sinus rhythm.  I have ordered an initial dose of apixaban.  I have  observed the patient for 1 hour following cardioversion, he remains in sinus rhythm and I feel he is stable for discharge.  I have ordered initial dose of apixaban and I am discharging him with a prescription for apixaban.  CHA2DS2-VASc score is 2 which indicates need for long-term anticoagulation.  I am referring him for the atrial fibrillation clinic for follow-up.  Final Clinical Impression(s) / ED Diagnoses Final diagnoses:  Paroxysmal atrial fibrillation with rapid ventricular response (HCC)  Nonspecific chest pain  Elevated random blood glucose level    Rx / DC Orders ED Discharge Orders          Ordered    apixaban (ELIQUIS) 5 MG TABS tablet  2 times daily        06/19/23 0143    Amb referral to AFIB Clinic        06/19/23 0001              Dione Booze, MD 06/19/23 0149

## 2023-06-19 LAB — MAGNESIUM: Magnesium: 2.1 mg/dL (ref 1.7–2.4)

## 2023-06-19 MED ORDER — APIXABAN 5 MG PO TABS: 5.0000 mg | ORAL_TABLET | Freq: Two times a day (BID) | ORAL | 0 refills | Status: DC

## 2023-06-19 MED ORDER — PROPOFOL 10 MG/ML IV BOLUS
0.5000 mg/kg | Freq: Once | INTRAVENOUS | Status: AC
  Administered 2023-06-19: 50 mg via INTRAVENOUS
  Filled 2023-06-19: qty 20

## 2023-06-19 MED ORDER — APIXABAN 5 MG PO TABS
5.0000 mg | ORAL_TABLET | Freq: Once | ORAL | Status: AC
  Administered 2023-06-19: 5 mg via ORAL
  Filled 2023-06-19: qty 1

## 2023-06-19 NOTE — ED Notes (Signed)
Patient verbalizes understanding of discharge instructions. Opportunity for questioning and answers were provided. Armband removed by staff, pt discharged from ED. Ambulated out to lobby with wife  

## 2023-06-19 NOTE — Progress Notes (Signed)
RT present for cardioversion. VSS throughout and no adverse events noted. Patient on 3 lpm nasal cannula.

## 2023-06-21 ENCOUNTER — Telehealth: Payer: Self-pay | Admitting: Cardiology

## 2023-06-21 NOTE — Telephone Encounter (Signed)
Patient was advised to keep appointment with A.Fib clinic d/t being treated at [facility] for afib w/ rvr. Pt verbalized understanding. Pt had no further questions or concern at this time.

## 2023-06-21 NOTE — Telephone Encounter (Signed)
Pt had a recent ED visit. He was told to f/u with afib clinic but he would like Dr. Verna Czech advice on it.

## 2023-06-23 ENCOUNTER — Ambulatory Visit (HOSPITAL_COMMUNITY)
Admission: RE | Admit: 2023-06-23 | Discharge: 2023-06-23 | Disposition: A | Discharge status: Home / Self Care | Payer: Medicare PPO | Source: Ambulatory Visit | Attending: Physician Assistant | Admitting: Physician Assistant

## 2023-06-23 VITALS — BP 164/108 | HR 96 | Ht 74.0 in | Wt 211.4 lb

## 2023-06-23 DIAGNOSIS — I480 Paroxysmal atrial fibrillation: Secondary | ICD-10-CM | POA: Insufficient documentation

## 2023-06-23 DIAGNOSIS — I10 Essential (primary) hypertension: Secondary | ICD-10-CM | POA: Diagnosis not present

## 2023-06-23 DIAGNOSIS — Z79899 Other long term (current) drug therapy: Secondary | ICD-10-CM | POA: Insufficient documentation

## 2023-06-23 DIAGNOSIS — D6869 Other thrombophilia: Secondary | ICD-10-CM | POA: Insufficient documentation

## 2023-06-23 DIAGNOSIS — R972 Elevated prostate specific antigen [PSA]: Secondary | ICD-10-CM | POA: Diagnosis not present

## 2023-06-23 DIAGNOSIS — Z7901 Long term (current) use of anticoagulants: Secondary | ICD-10-CM | POA: Diagnosis not present

## 2023-06-23 DIAGNOSIS — E669 Obesity, unspecified: Secondary | ICD-10-CM | POA: Diagnosis not present

## 2023-06-23 DIAGNOSIS — R7989 Other specified abnormal findings of blood chemistry: Secondary | ICD-10-CM | POA: Diagnosis not present

## 2023-06-23 DIAGNOSIS — R21 Rash and other nonspecific skin eruption: Secondary | ICD-10-CM | POA: Diagnosis not present

## 2023-06-23 DIAGNOSIS — Z6830 Body mass index (BMI) 30.0-30.9, adult: Secondary | ICD-10-CM | POA: Diagnosis not present

## 2023-06-23 MED ORDER — DILTIAZEM HCL ER COATED BEADS 120 MG PO CP24: 120.0000 mg | ORAL_CAPSULE | Freq: Every day | ORAL | 4 refills | Status: DC

## 2023-06-23 MED ORDER — APIXABAN 5 MG PO TABS: 5.0000 mg | ORAL_TABLET | Freq: Two times a day (BID) | ORAL | 4 refills | Status: DC

## 2023-06-23 NOTE — Progress Notes (Signed)
Primary Care Physician: Benita Stabile, MD Primary Cardiologist: Dina Rich, MD Electrophysiologist: None  Referring Physician: ED   Calvin Fernandez is a 67 y.o. male with a history of HTN and atrial fibrillation who presents for follow up in the [facility]rial Fibrillation [facility].  The patient was initially diagnosed with atrial fibrillation 06/18/23 after presenting to the ED with symptoms of chest pain. ECG showed afib with elevated rates. Patient underwent DCCV at that time. Patient was started on Eliquis for a CHADS2VASC score of 2.  On follow up today, patient reports that he has done well since discharge from an afib standpoint. However, his BP has been elevated. He has not been taking amlodipine or metoprolol due to a rash on his inner thighs which the patient attributes to these medications. No bleeding issues on anticoagulation. In hindsight, patient has noted intermittent afib on his Apple Watch over the past 2-3 months.   Today, he denies symptoms of palpitations, chest pain, shortness of breath, orthopnea, PND, lower extremity edema, dizziness, presyncope, syncope, snoring, daytime somnolence, bleeding, or neurologic sequela. The patient is tolerating medications without difficulties and is otherwise without complaint today.    Atrial Fibrillation Risk Factors:  he does not have symptoms or diagnosis of sleep apnea. he does not have a history of rheumatic fever. he does not have a history of alcohol use. The patient does not have a history of early familial atrial fibrillation or other arrhythmias.  Atrial Fibrillation Management history:  Previous antiarrhythmic drugs: none Previous cardioversions: 06/18/23 Previous ablations: none Anticoagulation history: Eliquis  ROS- All systems are reviewed and negative except as per the HPI above.  Past Medical History:  Diagnosis Date   Chest pain 2011   mild aortic plaque on CT of abdomen; negative stress  echocardiogram in 09/2011   GERD (gastroesophageal reflux disease)    Hypertension    multiple drug intolerances; noncompliance   Wears glasses     Current Outpatient Medications  Medication Sig Dispense Refill   diltiazem (CARDIZEM CD) 120 MG 24 hr capsule Take 1 capsule (120 mg total) by mouth daily. 30 capsule 4   hydrochlorothiazide (HYDRODIURIL) 12.5 MG tablet TAKE 1 TABLET(12.5 MG) BY MOUTH DAILY 90 tablet 3   apixaban (ELIQUIS) 5 MG TABS tablet Take 1 tablet (5 mg total) by mouth 2 (two) times daily. 60 tablet 4   No current facility-administered medications for this encounter.    Physical Exam: BP (!) 164/108   Pulse 96   Ht 6\' 2"  (1.88 m)   Wt 95.9 kg   BMI 27.14 kg/m   GEN: Well nourished, well developed in no acute distress NECK: No JVD; No carotid bruits CARDIAC: Regular rate and rhythm, no murmurs, rubs, gallops RESPIRATORY:  Clear to auscultation without rales, wheezing or rhonchi  ABDOMEN: Soft, non-tender, non-distended EXTREMITIES:  No edema; No deformity   Wt Readings from Last 3 Encounters:  06/23/23 95.9 kg  06/18/23 96.2 kg  04/12/23 97.9 kg     EKG today demonstrates  SR Vent. rate 96 BPM PR interval 158 ms QRS duration 86 ms QT/QTcB 358/452 ms     CHA2DS2-VASc Score = 2  The patient's score is based upon: CHF History: 0 HTN History: 1 Diabetes History: 0 Stroke History: 0 Vascular Disease History: 0 Age Score: 1 Gender Score: 0       ASSESSMENT AND PLAN: Paroxysmal Atrial Fibrillation (ICD10:  I48.0) The patient's CHA2DS2-VASc score is 2, indicating a 2.2% annual risk  of stroke.   General education about afib provided and questions answered. We also discussed his stroke risk and the risks and benefits of anticoagulation. Will start diltiazem 120 mg daily which hopefully will help both is afib and his BP. Can consider AAD or ablation if he continues to have episodes.  Apple Watch for home monitoring. Check echocardiogram Continue  Eliquis 5 mg BID  Secondary Hypercoagulable State (ICD10:  D68.69) The patient is at significant risk for stroke/thromboembolism based upon his CHA2DS2-VASc Score of 2.  Continue Apixaban (Eliquis).   HTN Elevated today, start diltiazem as above Patient discontinued amlodipine and metoprolol.   Follow up in the AF clinic in one month.        Jorja Loa PA-C Afib Clinic [facility] [address] [place], [place] [address] [phone]

## 2023-06-23 NOTE — Patient Instructions (Addendum)
Stop amlodipine and metoprolol   Start Cardizem (Diltiazem) 120mg  once a day

## 2023-07-05 ENCOUNTER — Telehealth (HOSPITAL_COMMUNITY): Payer: Self-pay | Admitting: *Deleted

## 2023-07-05 ENCOUNTER — Ambulatory Visit (HOSPITAL_COMMUNITY)
Admission: RE | Admit: 2023-07-05 | Discharge: 2023-07-05 | Disposition: A | Discharge status: Home / Self Care | Payer: Medicare PPO | Source: Ambulatory Visit | Attending: Physician Assistant | Admitting: Physician Assistant

## 2023-07-05 DIAGNOSIS — I480 Paroxysmal atrial fibrillation: Secondary | ICD-10-CM | POA: Diagnosis not present

## 2023-07-05 LAB — ECHOCARDIOGRAM COMPLETE
Area-P 1/2: 3.31 cm2
S' Lateral: 3.2 cm

## 2023-07-05 MED ORDER — HYDROCHLOROTHIAZIDE 12.5 MG PO TABS: 25.0000 mg | ORAL_TABLET | Freq: Every day | ORAL | Status: DC

## 2023-07-05 NOTE — Progress Notes (Signed)
*  PRELIMINARY RESULTS* Echocardiogram 2D Echocardiogram has been performed.  Stacey Drain 07/05/2023, 2:52 PM

## 2023-07-05 NOTE — Telephone Encounter (Signed)
Despite addition of cardizem pt continues having elevated BP. 158/97 HR 71. Discussed with Landry Mellow PA will try increasing hydrochlorothiazide to 25mg  daily. Pt to follow up as scheduled. Pt in agreement.

## 2023-07-13 ENCOUNTER — Telehealth (HOSPITAL_COMMUNITY): Payer: Self-pay | Admitting: *Deleted

## 2023-07-13 NOTE — Telephone Encounter (Addendum)
 Pt left message on afib clinic voicemail regarding issues with blood pressure management. Hydrochlorothiazide  was recently increased to 25mg  daily. Despite this increase pt continues having elevated BP. Pt with intolerance to amlodipine  and metoprolol . Will forward to primary cardiologist for further management of BP. Pt in NSR.

## 2023-07-15 MED ORDER — HYDROCHLOROTHIAZIDE 12.5 MG PO TABS: 12.5000 mg | ORAL_TABLET | Freq: Two times a day (BID) | ORAL | 3 refills | Status: DC

## 2023-07-15 NOTE — Telephone Encounter (Signed)
 Spoke with patient. In the AM his blood pressures are good 130s/70s in the evenings his blood pressures are spiking 150-160s-90s. Pt will try splitting up his BP medications to see if this will give better coverage of his blood pressure throughout the day. Hydrochlorothiazide  to 12.5mg  BID along with Diltiazem  in the evenings. Pt denies high sodium intake in evenings. If BP continues to spike in the evenings he will call back to speak with Dr. Ranae office for further management.

## 2023-07-19 ENCOUNTER — Other Ambulatory Visit (HOSPITAL_COMMUNITY): Payer: Self-pay | Admitting: *Deleted

## 2023-07-19 MED ORDER — APIXABAN 5 MG PO TABS: 5.0000 mg | ORAL_TABLET | Freq: Two times a day (BID) | ORAL | 4 refills | Status: DC

## 2023-07-29 ENCOUNTER — Ambulatory Visit (HOSPITAL_COMMUNITY): Payer: Medicare PPO | Admitting: Physician Assistant

## 2023-07-29 ENCOUNTER — Ambulatory Visit (HOSPITAL_COMMUNITY)
Admission: RE | Admit: 2023-07-29 | Discharge: 2023-07-29 | Disposition: A | Discharge status: Home / Self Care | Payer: Medicare PPO | Source: Ambulatory Visit | Attending: Physician Assistant | Admitting: Physician Assistant

## 2023-07-29 ENCOUNTER — Encounter (HOSPITAL_COMMUNITY): Payer: Self-pay | Admitting: Physician Assistant

## 2023-07-29 VITALS — BP 132/92 | HR 73 | Ht 74.0 in | Wt 206.6 lb

## 2023-07-29 DIAGNOSIS — H04123 Dry eye syndrome of bilateral lacrimal glands: Secondary | ICD-10-CM | POA: Diagnosis not present

## 2023-07-29 DIAGNOSIS — I480 Paroxysmal atrial fibrillation: Secondary | ICD-10-CM | POA: Diagnosis not present

## 2023-07-29 DIAGNOSIS — I10 Essential (primary) hypertension: Secondary | ICD-10-CM | POA: Insufficient documentation

## 2023-07-29 DIAGNOSIS — D6869 Other thrombophilia: Secondary | ICD-10-CM | POA: Insufficient documentation

## 2023-07-29 DIAGNOSIS — Z7901 Long term (current) use of anticoagulants: Secondary | ICD-10-CM | POA: Diagnosis not present

## 2023-07-29 LAB — BASIC METABOLIC PANEL
Anion gap: 11 (ref 5–15)
BUN: 15 mg/dL (ref 8–23)
CO2: 24 mmol/L (ref 22–32)
Calcium: 9.1 mg/dL (ref 8.9–10.3)
Chloride: 101 mmol/L (ref 98–111)
Creatinine, Ser: 1.09 mg/dL (ref 0.61–1.24)
GFR, Estimated: >60 mL/min (ref >60)
Glucose, Bld: 141 mg/dL — ABNORMAL HIGH (ref 70–99)
Potassium: 3.2 mmol/L — ABNORMAL LOW (ref 3.5–5.1)
Sodium: 136 mmol/L (ref 135–145)

## 2023-07-29 LAB — CBC
HCT: 46.9 % (ref 39.0–52.0)
Hemoglobin: 16.3 g/dL (ref 13.0–17.0)
MCH: 31.5 pg (ref 26.0–34.0)
MCHC: 34.8 g/dL (ref 30.0–36.0)
MCV: 90.5 fL (ref 80.0–100.0)
Platelets: 311 10*3/uL (ref 150–400)
RBC: 5.18 MIL/uL (ref 4.22–5.81)
RDW: 11.9 % (ref 11.5–15.5)
WBC: 8.4 10*3/uL (ref 4.0–10.5)
nRBC: 0 % (ref 0.0–0.2)

## 2023-07-29 MED ORDER — DILTIAZEM HCL ER COATED BEADS 180 MG PO CP24: 180.0000 mg | ORAL_CAPSULE | Freq: Every day | ORAL | 3 refills | Status: DC

## 2023-07-29 NOTE — Progress Notes (Signed)
Primary Care Physician: Benita Stabile, MD Primary Cardiologist: Dina Rich, MD Electrophysiologist: None  Referring Physician: ED   Calvin Fernandez is a 68 y.o. male with a history of HTN and atrial fibrillation who presents for follow up in the [facility].  The patient was initially diagnosed with atrial fibrillation 06/18/23 after presenting to the ED with symptoms of chest pain. ECG showed afib with elevated rates. Patient underwent DCCV at that time. Patient was started on Eliquis for a CHADS2VASC score of 2. Started on diltiazem at 06/23/23 visit.   On follow up today, patient reports that he did have two episodes of afib since his last visit. The episodes lasted 15-20 minutes. There were no specific triggers that he could identify. No bleeding issues on anticoagulation.   Today, he denies symptoms of chest pain, shortness of breath, orthopnea, PND, lower extremity edema, dizziness, presyncope, syncope, snoring, daytime somnolence, bleeding, or neurologic sequela. The patient is tolerating medications without difficulties and is otherwise without complaint today.    Atrial Fibrillation Risk Factors:  he does not have symptoms or diagnosis of sleep apnea. he does not have a history of rheumatic fever. he does not have a history of alcohol use. The patient does not have a history of early familial atrial fibrillation or other arrhythmias.  Atrial Fibrillation Management history:  Previous antiarrhythmic drugs: none Previous cardioversions: 06/18/23 Previous ablations: none Anticoagulation history: Eliquis  ROS- All systems are reviewed and negative except as per the HPI above.  Past Medical History:  Diagnosis Date   Chest pain 2011   mild aortic plaque on CT of abdomen; negative stress echocardiogram in 09/2011   GERD (gastroesophageal reflux disease)    Hypertension    multiple drug intolerances; noncompliance   Wears glasses      Current Outpatient Medications  Medication Sig Dispense Refill   apixaban (ELIQUIS) 5 MG TABS tablet Take 1 tablet (5 mg total) by mouth 2 (two) times daily. 60 tablet 4   diltiazem (CARDIZEM CD) 120 MG 24 hr capsule Take 1 capsule (120 mg total) by mouth daily. 30 capsule 4   hydrochlorothiazide (HYDRODIURIL) 12.5 MG tablet Take 1 tablet (12.5 mg total) by mouth 2 (two) times daily. 60 tablet 3   No current facility-administered medications for this encounter.    Physical Exam: BP (!) 132/92   Pulse 73   Ht 6\' 2"  (1.88 m)   Wt 93.7 kg   BMI 26.53 kg/m   GEN: Well nourished, well developed in no acute distress NECK: No JVD; No carotid bruits CARDIAC: Regular rate and rhythm, no murmurs, rubs, gallops RESPIRATORY:  Clear to auscultation without rales, wheezing or rhonchi  ABDOMEN: Soft, non-tender, non-distended EXTREMITIES:  No edema; No deformity    Wt Readings from Last 3 Encounters:  07/29/23 93.7 kg  06/23/23 95.9 kg  06/18/23 96.2 kg     EKG today demonstrates  SR, PVC Vent. rate 73 BPM PR interval 166 ms QRS duration 96 ms QT/QTcB 402/442 ms   Echo 07/05/23 1. Left ventricular ejection fraction, by estimation, is 55 to 60%. The  left ventricle has normal function. The left ventricle has no regional  wall motion abnormalities. Left ventricular diastolic parameters are  consistent with Grade I diastolic dysfunction (impaired relaxation).   2. Right ventricular systolic function is normal. The right ventricular  size is normal. Tricuspid regurgitation signal is inadequate for assessing  PA pressure.   3. Left atrial size was mildly  dilated.   4. The mitral valve is grossly normal. Trivial mitral valve  regurgitation.   5. The aortic valve is tricuspid. There is mild calcification of the  aortic valve. Aortic valve regurgitation is not visualized. Aortic valve  sclerosis/calcification is present, without any evidence of aortic  stenosis.   6. The inferior  vena cava is normal in size with greater than 50%  respiratory variability, suggesting right atrial pressure of 3 mmHg.   Comparison(s): Prior images unable to be directly viewed, comparison made  by report only.    CHA2DS2-VASc Score = 2  The patient's score is based upon: CHF History: 0 HTN History: 1 Diabetes History: 0 Stroke History: 0 Vascular Disease History: 0 Age Score: 1 Gender Score: 0       ASSESSMENT AND PLAN: Paroxysmal Atrial Fibrillation (ICD10:  I48.0) The patient's CHA2DS2-VASc score is 2, indicating a 2.2% annual risk of stroke.   Patient still having some brief episodes of tachypalpitations.  Will increase diltiazem to 180 mg daily Apple Watch for home monitoring. Continue Eliquis 5 mg BID  Secondary Hypercoagulable State (ICD10:  D68.69) The patient is at significant risk for stroke/thromboembolism based upon his CHA2DS2-VASc Score of 2.  Continue Apixaban (Eliquis).   HTN Stable today, increase diltiazem as above.   Follow up in the AF clinic in 3 months.       Jorja Loa PA-C Afib Clinic [facility] [address] [place], [place] [address] [phone]

## 2023-07-29 NOTE — Patient Instructions (Signed)
Increase cardizem to 180mg once a day 

## 2023-07-30 ENCOUNTER — Other Ambulatory Visit (HOSPITAL_COMMUNITY): Payer: Self-pay | Admitting: *Deleted

## 2023-07-30 DIAGNOSIS — I480 Paroxysmal atrial fibrillation: Secondary | ICD-10-CM

## 2023-07-30 MED ORDER — POTASSIUM CHLORIDE ER 10 MEQ PO TBCR: EXTENDED_RELEASE_TABLET | ORAL | 1 refills | Status: DC

## 2023-08-11 ENCOUNTER — Other Ambulatory Visit (HOSPITAL_COMMUNITY): Payer: Self-pay | Admitting: Physician Assistant

## 2023-08-11 DIAGNOSIS — I480 Paroxysmal atrial fibrillation: Secondary | ICD-10-CM | POA: Diagnosis not present

## 2023-08-12 LAB — BASIC METABOLIC PANEL
BUN/Creatinine Ratio: 15 (ref 10–24)
BUN: 16 mg/dL (ref 8–27)
CO2: 23 mmol/L (ref 20–29)
Calcium: 9.4 mg/dL (ref 8.6–10.2)
Chloride: 103 mmol/L (ref 96–106)
Creatinine, Ser: 1.07 mg/dL (ref 0.76–1.27)
Glucose: 92 mg/dL (ref 70–99)
Potassium: 4.1 mmol/L (ref 3.5–5.2)
Sodium: 144 mmol/L (ref 134–144)
eGFR: 76 mL/min/{1.73_m2} (ref >59)

## 2023-08-23 DIAGNOSIS — L820 Inflamed seborrheic keratosis: Secondary | ICD-10-CM | POA: Diagnosis not present

## 2023-08-23 DIAGNOSIS — R208 Other disturbances of skin sensation: Secondary | ICD-10-CM | POA: Diagnosis not present

## 2023-08-23 DIAGNOSIS — L501 Idiopathic urticaria: Secondary | ICD-10-CM | POA: Diagnosis not present

## 2023-08-23 DIAGNOSIS — L2989 Other pruritus: Secondary | ICD-10-CM | POA: Diagnosis not present

## 2023-08-23 DIAGNOSIS — L538 Other specified erythematous conditions: Secondary | ICD-10-CM | POA: Diagnosis not present

## 2023-08-27 ENCOUNTER — Emergency Department (HOSPITAL_COMMUNITY): Payer: Medicare PPO

## 2023-08-27 ENCOUNTER — Other Ambulatory Visit: Payer: Self-pay

## 2023-08-27 ENCOUNTER — Encounter (HOSPITAL_COMMUNITY): Payer: Self-pay

## 2023-08-27 ENCOUNTER — Emergency Department (HOSPITAL_COMMUNITY)
Admission: EM | Admit: 2023-08-27 | Discharge: 2023-08-27 | Disposition: A | Discharge status: Home / Self Care | Payer: Medicare PPO | Source: Home / Self Care | Attending: Emergency Medicine | Admitting: Emergency Medicine

## 2023-08-27 DIAGNOSIS — K831 Obstruction of bile duct: Secondary | ICD-10-CM | POA: Diagnosis present

## 2023-08-27 DIAGNOSIS — Z7901 Long term (current) use of anticoagulants: Secondary | ICD-10-CM | POA: Insufficient documentation

## 2023-08-27 DIAGNOSIS — K82A1 Gangrene of gallbladder in cholecystitis: Secondary | ICD-10-CM | POA: Diagnosis present

## 2023-08-27 DIAGNOSIS — Z8249 Family history of ischemic heart disease and other diseases of the circulatory system: Secondary | ICD-10-CM | POA: Diagnosis not present

## 2023-08-27 DIAGNOSIS — K8020 Calculus of gallbladder without cholecystitis without obstruction: Secondary | ICD-10-CM

## 2023-08-27 DIAGNOSIS — K8000 Calculus of gallbladder with acute cholecystitis without obstruction: Secondary | ICD-10-CM | POA: Diagnosis present

## 2023-08-27 DIAGNOSIS — R0602 Shortness of breath: Secondary | ICD-10-CM | POA: Diagnosis not present

## 2023-08-27 DIAGNOSIS — N200 Calculus of kidney: Secondary | ICD-10-CM | POA: Diagnosis not present

## 2023-08-27 DIAGNOSIS — I10 Essential (primary) hypertension: Secondary | ICD-10-CM | POA: Diagnosis present

## 2023-08-27 DIAGNOSIS — R1013 Epigastric pain: Secondary | ICD-10-CM | POA: Diagnosis not present

## 2023-08-27 DIAGNOSIS — Z887 Allergy status to serum and vaccine status: Secondary | ICD-10-CM | POA: Diagnosis not present

## 2023-08-27 DIAGNOSIS — R7401 Elevation of levels of liver transaminase levels: Secondary | ICD-10-CM | POA: Diagnosis not present

## 2023-08-27 DIAGNOSIS — I482 Chronic atrial fibrillation, unspecified: Secondary | ICD-10-CM | POA: Diagnosis present

## 2023-08-27 DIAGNOSIS — K8050 Calculus of bile duct without cholangitis or cholecystitis without obstruction: Secondary | ICD-10-CM | POA: Diagnosis not present

## 2023-08-27 DIAGNOSIS — K8012 Calculus of gallbladder with acute and chronic cholecystitis without obstruction: Secondary | ICD-10-CM | POA: Diagnosis not present

## 2023-08-27 DIAGNOSIS — K838 Other specified diseases of biliary tract: Secondary | ICD-10-CM | POA: Diagnosis not present

## 2023-08-27 DIAGNOSIS — K2100 Gastro-esophageal reflux disease with esophagitis, without bleeding: Secondary | ICD-10-CM | POA: Diagnosis not present

## 2023-08-27 DIAGNOSIS — R140 Abdominal distension (gaseous): Secondary | ICD-10-CM | POA: Diagnosis not present

## 2023-08-27 DIAGNOSIS — R0989 Other specified symptoms and signs involving the circulatory and respiratory systems: Secondary | ICD-10-CM | POA: Diagnosis not present

## 2023-08-27 DIAGNOSIS — Z79899 Other long term (current) drug therapy: Secondary | ICD-10-CM | POA: Insufficient documentation

## 2023-08-27 DIAGNOSIS — Z0389 Encounter for observation for other suspected diseases and conditions ruled out: Secondary | ICD-10-CM | POA: Diagnosis not present

## 2023-08-27 DIAGNOSIS — R748 Abnormal levels of other serum enzymes: Secondary | ICD-10-CM | POA: Diagnosis not present

## 2023-08-27 DIAGNOSIS — I4891 Unspecified atrial fibrillation: Secondary | ICD-10-CM | POA: Diagnosis not present

## 2023-08-27 DIAGNOSIS — K8689 Other specified diseases of pancreas: Secondary | ICD-10-CM | POA: Diagnosis not present

## 2023-08-27 DIAGNOSIS — I480 Paroxysmal atrial fibrillation: Secondary | ICD-10-CM | POA: Diagnosis not present

## 2023-08-27 DIAGNOSIS — K8001 Calculus of gallbladder with acute cholecystitis with obstruction: Secondary | ICD-10-CM | POA: Diagnosis not present

## 2023-08-27 DIAGNOSIS — K219 Gastro-esophageal reflux disease without esophagitis: Secondary | ICD-10-CM | POA: Diagnosis present

## 2023-08-27 DIAGNOSIS — R1011 Right upper quadrant pain: Secondary | ICD-10-CM | POA: Diagnosis present

## 2023-08-27 DIAGNOSIS — K810 Acute cholecystitis: Secondary | ICD-10-CM | POA: Diagnosis not present

## 2023-08-27 DIAGNOSIS — K828 Other specified diseases of gallbladder: Secondary | ICD-10-CM | POA: Diagnosis not present

## 2023-08-27 DIAGNOSIS — N400 Enlarged prostate without lower urinary tract symptoms: Secondary | ICD-10-CM | POA: Diagnosis not present

## 2023-08-27 HISTORY — DX: Unspecified atrial fibrillation: I48.91

## 2023-08-27 LAB — CBC WITH DIFFERENTIAL/PLATELET
Abs Immature Granulocytes: 0.05 10*3/uL (ref 0.00–0.07)
Basophils Absolute: 0.1 10*3/uL (ref 0.0–0.1)
Basophils Relative: 1 %
Eosinophils Absolute: 0.2 10*3/uL (ref 0.0–0.5)
Eosinophils Relative: 1 %
HCT: 47.2 % (ref 39.0–52.0)
Hemoglobin: 16.6 g/dL (ref 13.0–17.0)
Immature Granulocytes: 0 %
Lymphocytes Relative: 17 %
Lymphs Abs: 2.6 10*3/uL (ref 0.7–4.0)
MCH: 31.6 pg (ref 26.0–34.0)
MCHC: 35.2 g/dL (ref 30.0–36.0)
MCV: 89.9 fL (ref 80.0–100.0)
Monocytes Absolute: 1 10*3/uL (ref 0.1–1.0)
Monocytes Relative: 7 %
Neutro Abs: 11.5 10*3/uL — ABNORMAL HIGH (ref 1.7–7.7)
Neutrophils Relative %: 74 %
Platelets: 307 10*3/uL (ref 150–400)
RBC: 5.25 MIL/uL (ref 4.22–5.81)
RDW: 12.4 % (ref 11.5–15.5)
WBC: 15.4 10*3/uL — ABNORMAL HIGH (ref 4.0–10.5)
nRBC: 0 % (ref 0.0–0.2)

## 2023-08-27 LAB — COMPREHENSIVE METABOLIC PANEL
ALT: 27 U/L (ref 0–44)
AST: 27 U/L (ref 15–41)
Albumin: 4.3 g/dL (ref 3.5–5.0)
Alkaline Phosphatase: 48 U/L (ref 38–126)
Anion gap: 14 (ref 5–15)
BUN: 19 mg/dL (ref 8–23)
CO2: 27 mmol/L (ref 22–32)
Calcium: 9.9 mg/dL (ref 8.9–10.3)
Chloride: 98 mmol/L (ref 98–111)
Creatinine, Ser: 1.08 mg/dL (ref 0.61–1.24)
GFR, Estimated: >60 mL/min (ref >60)
Glucose, Bld: 123 mg/dL — ABNORMAL HIGH (ref 70–99)
Potassium: 3.3 mmol/L — ABNORMAL LOW (ref 3.5–5.1)
Sodium: 139 mmol/L (ref 135–145)
Total Bilirubin: 0.7 mg/dL (ref 0.0–1.2)
Total Protein: 7.9 g/dL (ref 6.5–8.1)

## 2023-08-27 LAB — LIPASE, BLOOD: Lipase: 39 U/L (ref 11–51)

## 2023-08-27 LAB — TROPONIN I (HIGH SENSITIVITY)
Troponin I (High Sensitivity): 7 ng/L (ref <18)
Troponin I (High Sensitivity): 7 ng/L (ref <18)

## 2023-08-27 MED ORDER — PANTOPRAZOLE SODIUM 40 MG PO TBEC: 40.0000 mg | DELAYED_RELEASE_TABLET | Freq: Two times a day (BID) | ORAL | 0 refills | Status: DC

## 2023-08-27 MED ORDER — IOHEXOL 300 MG/ML  SOLN
100.0000 mL | Freq: Once | INTRAMUSCULAR | Status: AC | PRN
  Administered 2023-08-27: 100 mL via INTRAVENOUS

## 2023-08-27 MED ORDER — HYDROMORPHONE HCL 1 MG/ML IJ SOLN
1.0000 mg | Freq: Once | INTRAMUSCULAR | Status: AC
  Administered 2023-08-27: 1 mg via INTRAVENOUS
  Filled 2023-08-27: qty 1

## 2023-08-27 MED ORDER — ALUM & MAG HYDROXIDE-SIMETH 200-200-20 MG/5ML PO SUSP
30.0000 mL | Freq: Once | ORAL | Status: AC
Start: 2023-08-27 — End: 2023-08-27
  Administered 2023-08-27: 30 mL via ORAL
  Filled 2023-08-27: qty 30

## 2023-08-27 MED ORDER — FAMOTIDINE IN NACL 20-0.9 MG/50ML-% IV SOLN
20.0000 mg | Freq: Once | INTRAVENOUS | Status: AC
  Administered 2023-08-27: 20 mg via INTRAVENOUS
  Filled 2023-08-27: qty 50

## 2023-08-27 MED ORDER — LIDOCAINE VISCOUS HCL 2 % MT SOLN
15.0000 mL | Freq: Once | OROMUCOSAL | Status: AC
Start: 2023-08-27 — End: 2023-08-27
  Administered 2023-08-27: 15 mL via ORAL
  Filled 2023-08-27: qty 15

## 2023-08-27 MED ORDER — SUCRALFATE 1 GM/10ML PO SUSP: 1.0000 g | Freq: Three times a day (TID) | ORAL | 0 refills | Status: DC

## 2023-08-27 MED ORDER — PANTOPRAZOLE SODIUM 40 MG IV SOLR
40.0000 mg | Freq: Once | INTRAVENOUS | Status: AC
  Administered 2023-08-27: 40 mg via INTRAVENOUS
  Filled 2023-08-27: qty 10

## 2023-08-27 MED ORDER — HYDROMORPHONE HCL 1 MG/ML IJ SOLN
1.0000 mg | Freq: Once | INTRAMUSCULAR | Status: AC
  Administered 2023-08-27: 1 mg via INTRAVENOUS
  Filled 2023-08-27 (×2): qty 1

## 2023-08-27 NOTE — ED Notes (Signed)
Pt took his home medications. Zyrtec, eliquis 5mg  & hydrochlorothiazide 12.5mg . MD aware

## 2023-08-27 NOTE — ED Notes (Addendum)
Na

## 2023-08-27 NOTE — ED Notes (Signed)
Patient transported to CT

## 2023-08-27 NOTE — ED Provider Notes (Signed)
Sent in from Dr. Clayborne Dana.  68 year old male here with epigastric abdominal pain.  Symptoms are improving.  CT showed large gallbladder and patient is pending right upper quadrant ultrasound.  If no signs of cholecystitis likely can be discharged to follow-up outpatient. Physical Exam  BP (!) 153/95   Pulse 63   Temp 97.8 F (36.6 C)   Resp 17   SpO2 94%   Physical Exam  Procedures  Procedures  ED Course / MDM    Medical Decision Making Amount and/or Complexity of Data Reviewed Labs: ordered. Radiology: ordered.  Risk OTC drugs. Prescription drug management.   Right upper quadrant ultrasound showing cholelithiasis without signs of cholecystitis.  I reevaluated patient he has no right upper quadrant tenderness at this time.  He feels his symptoms are much improved.  Recommended close follow-up with PCP and given contact information for general surgery.  Low-fat diet discussed.  Return instructions discussed.       Terrilee Files, MD 08/27/23 [phone]

## 2023-08-27 NOTE — ED Provider Notes (Signed)
 [place] EMERGENCY DEPARTMENT AT [facility] Provider Note   CSN: [id number] Arrival date & time: 08/27/23  4098     History  Chief Complaint  Patient presents with   Abdominal Pain    Calvin Fernandez is a 68 y.o. male.  68 year old male last ate food around noon maybe 1230 presents with about 7 to 8 hours of epigastric abdominal pain radiating.  Patient states that started around 5 or 6:00 yesterday evening.  Did not eat supper.  States progressively worsened.  Tried some BC powders did not help much.  States is a sharp pain in the epigastric area that radiates around both sides towards his back.  Never anything like this before.  No known history of indigestion.  No cardiac history.  No known cholelithiasis or pancreatic history.  Does not smoke or drink alcohol.  No liver issues that he is aware of.  No trauma.  No vomiting.  No diarrhea or constipation.  No shortness of breath or nausea.   Abdominal Pain      Home Medications Prior to Admission medications   Medication Sig Start Date End Date Taking? Authorizing Provider  apixaban (ELIQUIS) 5 MG TABS tablet Take 1 tablet (5 mg total) by mouth 2 (two) times daily. 07/19/23   Fenton, Clint R, PA  diltiazem (CARDIZEM CD) 180 MG 24 hr capsule Take 1 capsule (180 mg total) by mouth daily. 07/29/23   Fenton, Clint R, PA  hydrochlorothiazide (HYDRODIURIL) 12.5 MG tablet Take 1 tablet (12.5 mg total) by mouth 2 (two) times daily. 07/15/23   Fenton, Clint R, PA  potassium chloride (KLOR-CON) 10 MEQ tablet Take 4 tablets (40 mEq total) by mouth daily for 1 day, THEN 1 tablet (10 mEq total) daily. 07/30/23 07/30/24  Fenton, Clint R, PA      Allergies    Tetanus toxoid    Review of Systems   Review of Systems  Gastrointestinal:  Positive for abdominal pain.    Physical Exam Updated Vital Signs BP (!) 142/115   Pulse 61   Temp 97.8 F (36.6 C)   Resp 18   SpO2 99%  Physical Exam Vitals and nursing note reviewed.   Constitutional:      Appearance: He is well-developed.  HENT:     Head: Normocephalic and atraumatic.  Cardiovascular:     Rate and Rhythm: Normal rate.  Pulmonary:     Effort: Pulmonary effort is normal. No respiratory distress.  Abdominal:     General: There is no distension.     Tenderness: There is abdominal tenderness in the epigastric area. There is no guarding or rebound. Negative signs include Murphy's sign.  Musculoskeletal:        General: Normal range of motion.     Cervical back: Normal range of motion.  Neurological:     Mental Status: He is alert.     ED Results / Procedures / Treatments   Labs (all labs ordered are listed, but only abnormal results are displayed) Labs Reviewed  CBC WITH DIFFERENTIAL/PLATELET  COMPREHENSIVE METABOLIC PANEL  LIPASE, BLOOD  TROPONIN I (HIGH SENSITIVITY)    EKG None  Radiology No results found.  Procedures Procedures    Medications Ordered in ED Medications  alum & mag hydroxide-simeth (MAALOX/MYLANTA) 200-200-20 MG/5ML suspension 30 mL (30 mLs Oral Given 08/27/23 0247)    And  lidocaine (XYLOCAINE) 2 % viscous mouth solution 15 mL (15 mLs Oral Given 08/27/23 0247)  HYDROmorphone (DILAUDID) injection 1 mg (1  mg Intravenous Given 08/27/23 0247)    ED Course/ Medical Decision Making/ A&P                                 Medical Decision Making Amount and/or Complexity of Data Reviewed Labs: ordered. Radiology: ordered.  Risk OTC drugs. Prescription drug management.  Initial concern for most likely GI origin pancreatitis versus cholecystitis versus peptic ulcer disease we will treat appropriately and check labs and CT scan.  Also consider possible cardiac etiologies of troponins done EKG already done reviewed by myself is being reassuring.  Chest x-ray is pending.  Will reevaluate for symptoms.  Labs and imaging reviewed interpreted by myself without any obvious abnormalities aside from large gallbladder.  Radiology  does not see any secondary signs of infection versus that was high on my differential initially will wait for ultrasound to confirm there is no radiographically occult stone.  Patient feels much better.  I suspect likely causes probably peptic ulcer disease.  Patient is tolerating some fluids.  Pepcid and Protonix given.  Care transferred pending ultrasound and reevaluation for disposition.   Final Clinical Impression(s) / ED Diagnoses Final diagnoses:  None    Rx / DC Orders ED Discharge Orders     None         Latunya Kissick, Barbara Cower, MD 08/28/23 [phone]

## 2023-08-27 NOTE — ED Triage Notes (Signed)
Pt states that he woke up tonight and had bad epigastric pain that is radiates to both sides on his back. Endorses nausea.

## 2023-08-28 ENCOUNTER — Inpatient Hospital Stay (HOSPITAL_COMMUNITY)
Admission: EM | Admit: 2023-08-28 | Discharge: 2023-09-01 | DRG: 418 | Disposition: A | Discharge status: Home / Self Care | Payer: Medicare PPO | Attending: Internal Medicine | Admitting: Internal Medicine

## 2023-08-28 ENCOUNTER — Other Ambulatory Visit: Payer: Self-pay

## 2023-08-28 ENCOUNTER — Encounter (HOSPITAL_COMMUNITY): Payer: Self-pay | Admitting: Emergency Medicine

## 2023-08-28 ENCOUNTER — Encounter (HOSPITAL_COMMUNITY): Payer: Self-pay

## 2023-08-28 ENCOUNTER — Emergency Department (HOSPITAL_COMMUNITY): Payer: Medicare PPO

## 2023-08-28 ENCOUNTER — Emergency Department (HOSPITAL_COMMUNITY)
Admission: EM | Admit: 2023-08-28 | Discharge: 2023-08-28 | Disposition: A | Discharge status: Home / Self Care | Payer: Medicare PPO | Source: Home / Self Care | Attending: Emergency Medicine | Admitting: Emergency Medicine

## 2023-08-28 DIAGNOSIS — K219 Gastro-esophageal reflux disease without esophagitis: Secondary | ICD-10-CM | POA: Diagnosis present

## 2023-08-28 DIAGNOSIS — K8001 Calculus of gallbladder with acute cholecystitis with obstruction: Secondary | ICD-10-CM | POA: Diagnosis not present

## 2023-08-28 DIAGNOSIS — I10 Essential (primary) hypertension: Secondary | ICD-10-CM | POA: Diagnosis present

## 2023-08-28 DIAGNOSIS — Z7901 Long term (current) use of anticoagulants: Secondary | ICD-10-CM

## 2023-08-28 DIAGNOSIS — R7401 Elevation of levels of liver transaminase levels: Secondary | ICD-10-CM | POA: Diagnosis not present

## 2023-08-28 DIAGNOSIS — R748 Abnormal levels of other serum enzymes: Secondary | ICD-10-CM | POA: Insufficient documentation

## 2023-08-28 DIAGNOSIS — Z79899 Other long term (current) drug therapy: Secondary | ICD-10-CM | POA: Diagnosis not present

## 2023-08-28 DIAGNOSIS — K831 Obstruction of bile duct: Secondary | ICD-10-CM | POA: Diagnosis present

## 2023-08-28 DIAGNOSIS — K810 Acute cholecystitis: Secondary | ICD-10-CM | POA: Diagnosis present

## 2023-08-28 DIAGNOSIS — K2100 Gastro-esophageal reflux disease with esophagitis, without bleeding: Secondary | ICD-10-CM | POA: Diagnosis not present

## 2023-08-28 DIAGNOSIS — I4891 Unspecified atrial fibrillation: Secondary | ICD-10-CM | POA: Diagnosis not present

## 2023-08-28 DIAGNOSIS — K82A1 Gangrene of gallbladder in cholecystitis: Principal | ICD-10-CM | POA: Diagnosis present

## 2023-08-28 DIAGNOSIS — K8050 Calculus of bile duct without cholangitis or cholecystitis without obstruction: Secondary | ICD-10-CM | POA: Insufficient documentation

## 2023-08-28 DIAGNOSIS — R1011 Right upper quadrant pain: Secondary | ICD-10-CM | POA: Diagnosis present

## 2023-08-28 DIAGNOSIS — I482 Chronic atrial fibrillation, unspecified: Secondary | ICD-10-CM | POA: Diagnosis present

## 2023-08-28 DIAGNOSIS — I480 Paroxysmal atrial fibrillation: Secondary | ICD-10-CM | POA: Diagnosis not present

## 2023-08-28 DIAGNOSIS — Z8249 Family history of ischemic heart disease and other diseases of the circulatory system: Secondary | ICD-10-CM

## 2023-08-28 DIAGNOSIS — Z887 Allergy status to serum and vaccine status: Secondary | ICD-10-CM

## 2023-08-28 DIAGNOSIS — K8000 Calculus of gallbladder with acute cholecystitis without obstruction: Secondary | ICD-10-CM | POA: Diagnosis present

## 2023-08-28 DIAGNOSIS — R1010 Upper abdominal pain, unspecified: Principal | ICD-10-CM

## 2023-08-28 DIAGNOSIS — K819 Cholecystitis, unspecified: Secondary | ICD-10-CM

## 2023-08-28 LAB — CBC WITH DIFFERENTIAL/PLATELET
Abs Immature Granulocytes: 0.04 10*3/uL (ref 0.00–0.07)
Abs Immature Granulocytes: 0.05 10*3/uL (ref 0.00–0.07)
Abs Immature Granulocytes: 0.05 10*3/uL (ref 0.00–0.07)
Basophils Absolute: 0 10*3/uL (ref 0.0–0.1)
Basophils Absolute: 0.1 10*3/uL (ref 0.0–0.1)
Basophils Absolute: 0 10*3/uL (ref 0.0–0.1)
Basophils Relative: 0 %
Basophils Relative: 0 %
Basophils Relative: 0 %
Eosinophils Absolute: 0.2 10*3/uL (ref 0.0–0.5)
Eosinophils Absolute: 0.1 10*3/uL (ref 0.0–0.5)
Eosinophils Absolute: 0 10*3/uL (ref 0.0–0.5)
Eosinophils Relative: 1 %
Eosinophils Relative: 0 %
Eosinophils Relative: 0 %
HCT: 43.9 % (ref 39.0–52.0)
HCT: 47.5 % (ref 39.0–52.0)
HCT: 43.6 % (ref 39.0–52.0)
Hemoglobin: 15.7 g/dL (ref 13.0–17.0)
Hemoglobin: 16.5 g/dL (ref 13.0–17.0)
Hemoglobin: 15.3 g/dL (ref 13.0–17.0)
Immature Granulocytes: 0 %
Immature Granulocytes: 0 %
Immature Granulocytes: 0 %
Lymphocytes Relative: 10 %
Lymphocytes Relative: 8 %
Lymphocytes Relative: 3 %
Lymphs Abs: 1.3 10*3/uL (ref 0.7–4.0)
Lymphs Abs: 1.1 10*3/uL (ref 0.7–4.0)
Lymphs Abs: 0.4 10*3/uL — ABNORMAL LOW (ref 0.7–4.0)
MCH: 31.9 pg (ref 26.0–34.0)
MCH: 31.5 pg (ref 26.0–34.0)
MCH: 31.8 pg (ref 26.0–34.0)
MCHC: 35.8 g/dL (ref 30.0–36.0)
MCHC: 34.7 g/dL (ref 30.0–36.0)
MCHC: 35.1 g/dL (ref 30.0–36.0)
MCV: 89.2 fL (ref 80.0–100.0)
MCV: 90.8 fL (ref 80.0–100.0)
MCV: 90.6 fL (ref 80.0–100.0)
Monocytes Absolute: 1.2 10*3/uL — ABNORMAL HIGH (ref 0.1–1.0)
Monocytes Absolute: 1.5 10*3/uL — ABNORMAL HIGH (ref 0.1–1.0)
Monocytes Absolute: 1.2 10*3/uL — ABNORMAL HIGH (ref 0.1–1.0)
Monocytes Relative: 9 %
Monocytes Relative: 10 %
Monocytes Relative: 10 %
Neutro Abs: 10.4 10*3/uL — ABNORMAL HIGH (ref 1.7–7.7)
Neutro Abs: 12 10*3/uL — ABNORMAL HIGH (ref 1.7–7.7)
Neutro Abs: 10.7 10*3/uL — ABNORMAL HIGH (ref 1.7–7.7)
Neutrophils Relative %: 80 %
Neutrophils Relative %: 82 %
Neutrophils Relative %: 87 %
Platelets: 247 10*3/uL (ref 150–400)
Platelets: 267 10*3/uL (ref 150–400)
Platelets: 236 10*3/uL (ref 150–400)
RBC: 4.92 MIL/uL (ref 4.22–5.81)
RBC: 5.23 MIL/uL (ref 4.22–5.81)
RBC: 4.81 MIL/uL (ref 4.22–5.81)
RDW: 12.6 % (ref 11.5–15.5)
RDW: 12.7 % (ref 11.5–15.5)
RDW: 12.6 % (ref 11.5–15.5)
WBC: 13.2 10*3/uL — ABNORMAL HIGH (ref 4.0–10.5)
WBC: 14.8 10*3/uL — ABNORMAL HIGH (ref 4.0–10.5)
WBC: 12.4 10*3/uL — ABNORMAL HIGH (ref 4.0–10.5)
nRBC: 0 % (ref 0.0–0.2)
nRBC: 0 % (ref 0.0–0.2)
nRBC: 0 % (ref 0.0–0.2)

## 2023-08-28 LAB — COMPREHENSIVE METABOLIC PANEL
ALT: 74 U/L — ABNORMAL HIGH (ref 0–44)
ALT: 526 U/L — ABNORMAL HIGH (ref 0–44)
AST: 45 U/L — ABNORMAL HIGH (ref 15–41)
AST: 574 U/L — ABNORMAL HIGH (ref 15–41)
Albumin: 3.7 g/dL (ref 3.5–5.0)
Albumin: 3.8 g/dL (ref 3.5–5.0)
Alkaline Phosphatase: 44 U/L (ref 38–126)
Alkaline Phosphatase: 102 U/L (ref 38–126)
Anion gap: 11 (ref 5–15)
Anion gap: 12 (ref 5–15)
BUN: 14 mg/dL (ref 8–23)
BUN: 16 mg/dL (ref 8–23)
CO2: 24 mmol/L (ref 22–32)
CO2: 27 mmol/L (ref 22–32)
Calcium: 9.2 mg/dL (ref 8.9–10.3)
Calcium: 9.2 mg/dL (ref 8.9–10.3)
Chloride: 99 mmol/L (ref 98–111)
Chloride: 96 mmol/L — ABNORMAL LOW (ref 98–111)
Creatinine, Ser: 0.92 mg/dL (ref 0.61–1.24)
Creatinine, Ser: 0.96 mg/dL (ref 0.61–1.24)
GFR, Estimated: >60 mL/min (ref >60)
GFR, Estimated: >60 mL/min (ref >60)
Glucose, Bld: 144 mg/dL — ABNORMAL HIGH (ref 70–99)
Glucose, Bld: 132 mg/dL — ABNORMAL HIGH (ref 70–99)
Potassium: 3.1 mmol/L — ABNORMAL LOW (ref 3.5–5.1)
Potassium: 3.8 mmol/L (ref 3.5–5.1)
Sodium: 134 mmol/L — ABNORMAL LOW (ref 135–145)
Sodium: 135 mmol/L (ref 135–145)
Total Bilirubin: 1.8 mg/dL — ABNORMAL HIGH (ref 0.0–1.2)
Total Bilirubin: 5.7 mg/dL — ABNORMAL HIGH (ref 0.0–1.2)
Total Protein: 7 g/dL (ref 6.5–8.1)
Total Protein: 7.3 g/dL (ref 6.5–8.1)

## 2023-08-28 LAB — URINALYSIS, ROUTINE W REFLEX MICROSCOPIC
Bacteria, UA: NONE SEEN
Glucose, UA: NEGATIVE mg/dL
Ketones, ur: NEGATIVE mg/dL
Nitrite: NEGATIVE
Protein, ur: 30 mg/dL — AB
Specific Gravity, Urine: 1.025 (ref 1.005–1.030)
pH: 5 (ref 5.0–8.0)

## 2023-08-28 LAB — LIPASE, BLOOD
Lipase: 30 U/L (ref 11–51)
Lipase: 91 U/L — ABNORMAL HIGH (ref 11–51)

## 2023-08-28 MED ORDER — HYDROMORPHONE HCL 1 MG/ML IJ SOLN
1.0000 mg | Freq: Once | INTRAMUSCULAR | Status: AC
  Administered 2023-08-28: 1 mg via INTRAVENOUS
  Filled 2023-08-28: qty 1

## 2023-08-28 MED ORDER — ONDANSETRON 4 MG PO TBDP: ORAL_TABLET | ORAL | 0 refills | Status: DC

## 2023-08-28 MED ORDER — HYDROMORPHONE HCL 1 MG/ML IJ SOLN
1.0000 mg | Freq: Once | INTRAMUSCULAR | Status: AC
  Administered 2023-08-28: 1 mg via INTRAVENOUS
  Filled 2023-08-28 (×2): qty 1

## 2023-08-28 MED ORDER — GADOBUTROL 1 MMOL/ML IV SOLN
9.5000 mL | Freq: Once | INTRAVENOUS | Status: AC | PRN
  Administered 2023-08-28: 9.5 mL via INTRAVENOUS

## 2023-08-28 MED ORDER — OXYCODONE-ACETAMINOPHEN 5-325 MG PO TABS: 1.0000 | ORAL_TABLET | ORAL | 0 refills | Status: DC | PRN

## 2023-08-28 MED ORDER — ONDANSETRON HCL 4 MG/2ML IJ SOLN
4.0000 mg | Freq: Once | INTRAMUSCULAR | Status: AC
  Administered 2023-08-28: 4 mg via INTRAVENOUS
  Filled 2023-08-28: qty 2

## 2023-08-28 MED ORDER — LACTATED RINGERS IV SOLN: INTRAVENOUS | Status: AC

## 2023-08-28 MED ORDER — PIPERACILLIN-TAZOBACTAM 3.375 G IVPB
3.3750 g | Freq: Three times a day (TID) | INTRAVENOUS | Status: DC
  Administered 2023-08-28 – 2023-09-01 (×12): 3.375 g via INTRAVENOUS
  Filled 2023-08-28 (×12): qty 50

## 2023-08-28 MED ORDER — SODIUM CHLORIDE 0.9 % IV BOLUS
250.0000 mL | Freq: Once | INTRAVENOUS | Status: DC
Start: 2023-08-28 — End: 2023-08-28

## 2023-08-28 MED ORDER — SODIUM CHLORIDE 0.9 % IV BOLUS
1000.0000 mL | Freq: Once | INTRAVENOUS | Status: AC
  Administered 2023-08-28: 1000 mL via INTRAVENOUS

## 2023-08-28 MED ORDER — OXYCODONE-ACETAMINOPHEN 5-325 MG PO TABS: 1.0000 | ORAL_TABLET | Freq: Four times a day (QID) | ORAL | 0 refills | Status: DC | PRN

## 2023-08-28 NOTE — ED Triage Notes (Signed)
 Pt discharged this morning for upper abd pain, and presents with same. States he was recently diagnosed with gallstone, but unable to perform surgery d/t needing to hold Eliquis prior. Pt endorses nausea without vomiting.

## 2023-08-28 NOTE — Consult Note (Signed)
 Reason for Consult: Right upper quadrant abdominal pain Referring Physician: Dr. Katherine Basset KINSLER SOEDER is an 68 y.o. male.  HPI: Patient is a 68 year old white male who returns back to the emergency room this morning with worsening epigastric and right upper quadrant abdominal pain.  He has a known gallstone in the gallbladder as he was previously seen in the emergency room 24 hours earlier.  As his pain had resolved, he was sent home.  At that time, he had no significant transaminitis.  Only a slightly elevated white blood cell count was noted.  Lipase was normal.  He now returns and he has significant transaminitis with a bilirubin of 5.7.  His lipase is also elevated at 91.  He is comfortable now after having pain medication given.  He denies any fever or chills.  He states he has had intermittent upper abdominal pain in the past. Patient is on Eliquis for atrial fibrillation.  He last took a dose yesterday evening. Past Medical History:  Diagnosis Date   A-fib (HCC)    Chest pain 07/13/2009   mild aortic plaque on CT of abdomen; negative stress echocardiogram in 09/2011   GERD (gastroesophageal reflux disease)    Hypertension    multiple drug intolerances; noncompliance   Wears glasses     Past Surgical History:  Procedure Laterality Date   EXTENSOR TENDON OF FOREARM / WRIST REPAIR  1990   left   HIP PINNING,CANNULATED Right 11/12/2014   Procedure: PERCUTANEOUS PINNING RIGHT HIP ;  Surgeon: Gean Birchwood, MD;  Location: [facility];  Service: Orthopedics;  Laterality: Right;    Family History  Problem Relation Age of Onset   Heart attack Father 30    Social History:  reports that he has never smoked. He has never used smokeless tobacco. He reports that he does not drink alcohol and does not use drugs.  Allergies:  Allergies  Allergen Reactions   Tetanus Toxoid Rash    Medications: Prior to Admission: (Not in a hospital admission)   Results for orders placed or  performed during the hospital encounter of 08/28/23 (from the past 48 hours)  CBC with Differential     Status: Abnormal   Collection Time: 08/28/23  9:29 AM  Result Value Ref Range   WBC 14.8 (H) 4.0 - 10.5 K/uL   RBC 5.23 4.22 - 5.81 MIL/uL   Hemoglobin 16.5 13.0 - 17.0 g/dL   HCT 16.1 09.6 - 04.5 %   MCV 90.8 80.0 - 100.0 fL   MCH 31.5 26.0 - 34.0 pg   MCHC 34.7 30.0 - 36.0 g/dL   RDW 40.9 81.1 - 91.4 %   Platelets 267 150 - 400 K/uL   nRBC 0.0 0.0 - 0.2 %   Neutrophils Relative % 82 %   Neutro Abs 12.0 (H) 1.7 - 7.7 K/uL   Lymphocytes Relative 8 %   Lymphs Abs 1.1 0.7 - 4.0 K/uL   Monocytes Relative 10 %   Monocytes Absolute 1.5 (H) 0.1 - 1.0 K/uL   Eosinophils Relative 0 %   Eosinophils Absolute 0.1 0.0 - 0.5 K/uL   Basophils Relative 0 %   Basophils Absolute 0.1 0.0 - 0.1 K/uL   Immature Granulocytes 0 %   Abs Immature Granulocytes 0.05 0.00 - 0.07 K/uL    Comment: Performed at [facility], [address], [place], [place] [address]  CBC with Differential/Platelet     Status: Abnormal   Collection Time: 08/28/23 10:36 AM  Result  Value Ref Range   WBC 12.4 (H) 4.0 - 10.5 K/uL   RBC 4.81 4.22 - 5.81 MIL/uL   Hemoglobin 15.3 13.0 - 17.0 g/dL   HCT 16.1 09.6 - 04.5 %   MCV 90.6 80.0 - 100.0 fL   MCH 31.8 26.0 - 34.0 pg   MCHC 35.1 30.0 - 36.0 g/dL   RDW 40.9 81.1 - 91.4 %   Platelets 236 150 - 400 K/uL   nRBC 0.0 0.0 - 0.2 %   Neutrophils Relative % 87 %   Neutro Abs 10.7 (H) 1.7 - 7.7 K/uL   Lymphocytes Relative 3 %   Lymphs Abs 0.4 (L) 0.7 - 4.0 K/uL   Monocytes Relative 10 %   Monocytes Absolute 1.2 (H) 0.1 - 1.0 K/uL   Eosinophils Relative 0 %   Eosinophils Absolute 0.0 0.0 - 0.5 K/uL   Basophils Relative 0 %   Basophils Absolute 0.0 0.0 - 0.1 K/uL   Immature Granulocytes 0 %   Abs Immature Granulocytes 0.05 0.00 - 0.07 K/uL    Comment: Performed at [facility], [address], [place], [place] [address]  Comprehensive metabolic panel     Status: Abnormal    Collection Time: 08/28/23 10:58 AM  Result Value Ref Range   Sodium 135 135 - 145 mmol/L   Potassium 3.8 3.5 - 5.1 mmol/L   Chloride 96 (L) 98 - 111 mmol/L   CO2 27 22 - 32 mmol/L   Glucose, Bld 132 (H) 70 - 99 mg/dL    Comment: Glucose reference range applies only to samples taken after fasting for at least 8 hours.   BUN 16 8 - 23 mg/dL   Creatinine, Ser 6.21 0.61 - 1.24 mg/dL   Calcium 9.2 8.9 - 30.8 mg/dL   Total Protein 7.3 6.5 - 8.1 g/dL   Albumin 3.8 3.5 - 5.0 g/dL   AST 657 (H) 15 - 41 U/L   ALT 526 (H) 0 - 44 U/L   Alkaline Phosphatase 102 38 - 126 U/L   Total Bilirubin 5.7 (H) 0.0 - 1.2 mg/dL   GFR, Estimated >84 >69 mL/min    Comment: (NOTE) Calculated using the CKD-EPI Creatinine Equation (2021)    Anion gap 12 5 - 15    Comment: Performed at [facility], [address] [place], [place] [address]  Lipase, blood     Status: Abnormal   Collection Time: 08/28/23 10:58 AM  Result Value Ref Range   Lipase 91 (H) 11 - 51 U/L    Comment: Performed at [facility], [address], [place], [place] [address]    DG Chest Port 1 View Result Date: 08/28/2023 CLINICAL DATA:  Shortness of breath.  Epigastric pain. EXAM: PORTABLE CHEST 1 VIEW COMPARISON:  08/27/2023. FINDINGS: Low lung volume. Bilateral lung fields are clear. Bilateral costophrenic angles are clear. Stable cardio-mediastinal silhouette. No acute osseous abnormalities. The soft tissues are within normal limits. IMPRESSION: No active disease. Electronically Signed   By: Jules Schick M.D.   On: 08/28/2023 10:33   US Abdomen Limited RUQ (LIVER/GB) Result Date: 08/27/2023 CLINICAL DATA:  [id number] Abdominal pain [id number] EXAM: ULTRASOUND ABDOMEN LIMITED RIGHT UPPER QUADRANT COMPARISON:  None Available. FINDINGS: Gallbladder: Physiologically distended. No abnormal wall thickening. No pericholecystic free fluid. There is a single 2.5 cm calculus in the neck region. Sonographic Murphy's sign was negative as per the technologist.  Common bile duct: Diameter: Up to 5.4 mm.  No intrahepatic bile duct dilation. Liver: There is increased hepatic  echogenicity which reduces the sensitivity of ultrasound for the detection of focal masses. That being said, no focal mass is identified. Portal vein is patent on color Doppler imaging with normal direction of blood flow towards the liver. Other: None. IMPRESSION: 1. There is a 2.5 cm gallstone without imaging signs of acute cholecystitis. 2. Increased hepatic echogenicity, a nonspecific finding that is most commonly seen on the basis of steatosis in the absence of known liver disease. 3. Otherwise unremarkable exam. Electronically Signed   By: Jules Schick M.D.   On: 08/27/2023 09:44   DG Chest 2 View Result Date: 08/27/2023 CLINICAL DATA:  Evaluation for pain EXAM: CHEST - 2 VIEW COMPARISON:  06/18/2023 FINDINGS: Stable cardiomediastinal silhouette. Low lung volumes accentuate pulmonary vascularity. No focal consolidation, pleural effusion, or pneumothorax. No displaced rib fractures. IMPRESSION: No active cardiopulmonary disease. Electronically Signed   By: Minerva Fester M.D.   On: 08/27/2023 03:54   CT ABDOMEN PELVIS W CONTRAST Result Date: 08/27/2023 CLINICAL DATA:  Epigastric abdominal pain EXAM: CT ABDOMEN AND PELVIS WITH CONTRAST TECHNIQUE: Multidetector CT imaging of the abdomen and pelvis was performed using the standard protocol following bolus administration of intravenous contrast. RADIATION DOSE REDUCTION: This exam was performed according to the departmental dose-optimization program which includes automated exposure control, adjustment of the mA and/or kV according to patient size and/or use of iterative reconstruction technique. CONTRAST:  OMNIPAQUE IOHEXOL 300 MG/ML  SOLN COMPARISON:  03/13/2010 FINDINGS: Lower chest: No acute abnormality. Hepatobiliary: The gallbladder is distended, however common no superimposed acute inflammatory changes are identified. Mild hepatic  steatosis. No enhancing intrahepatic mass. No intra or extrahepatic biliary ductal dilation. Pancreas: Unremarkable Spleen: Unremarkable Adrenals/Urinary Tract: The adrenal glands are unremarkable. The kidneys are normal in size and position. 5 mm nonobstructing calculus noted within the right kidney. The kidneys are otherwise unremarkable. No hydronephrosis. No ureteral calculi. The bladder is decompressed. Stomach/Bowel: Stomach is within normal limits. Appendix appears normal. No evidence of bowel wall thickening, distention, or inflammatory changes. Vascular/Lymphatic: Aortic atherosclerosis. No enlarged abdominal or pelvic lymph nodes. Reproductive: Moderate prostatic hypertrophy Other: No abdominal wall hernia or abnormality. No abdominopelvic ascites. Musculoskeletal: Status post right hip pinning. No acute bone abnormality. No lytic or blastic bone lesion. Osseous structures are age-appropriate. IMPRESSION: 1. No acute intra-abdominal pathology identified. No definite radiographic explanation for the patient's reported symptoms. 2. Mild hepatic steatosis. 3. 5 mm nonobstructing right renal calculus. No ureteral calculi. No hydronephrosis. 4. Moderate prostatic hypertrophy. Aortic Atherosclerosis (ICD10-I70.0). Electronically Signed   By: Helyn Numbers M.D.   On: 08/27/2023 03:48    ROS:  Pertinent items are noted in HPI.  Blood pressure (!) 130/90, pulse 61, temperature 97.6 F (36.4 C), resp. rate 16, height 6\' 2"  (1.88 m), weight 95.3 kg, SpO2 95%. Physical Exam: Pleasant white male no acute distress Head is normocephalic, atraumatic Lungs are clear to auscultation with equal breath sounds bilaterally Heart examination reveals an irregular rate and rhythm Abdomen is soft with minimal tenderness in the right upper quadrant to palpation.  No rigidity is noted.  Assessment/Plan: Impression: Acute cholecystitis, cholelithiasis, now with significant transaminitis and possible gallstone  pancreatitis.  Jaundice is present.  Need to rule out cholangitis. Plan: Patient will get MRCP to further evaluate the hepatobiliary tree.  I have started him on Zosyn.  Further management is pending those results.  He should stay off his Eliquis.  Calvin Fernandez 08/28/2023, 11:34 AM

## 2023-08-28 NOTE — ED Notes (Signed)
 Pt given water per Dr Rhae Hammock ok

## 2023-08-28 NOTE — ED Provider Notes (Signed)
 I was asked to follow-up on the MRCP for the patient and to discuss the patient's case with surgery after this resulted.  He is here for cholecystitis with elevated LFTs.  Physical Exam  BP 126/80   Pulse 60   Temp 98.8 F (37.1 C)   Resp 17   Ht 6\' 2"  (1.88 m)   Wt 95.3 kg   SpO2 95%   BMI 26.96 kg/m   Physical Exam  Procedures  Procedures  ED Course / MDM    Medical Decision Making Amount and/or Complexity of Data Reviewed Labs: ordered. Radiology: ordered.  Risk Prescription drug management. Decision regarding hospitalization.   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  I did call and discussed this with Dr. Lovell Sheehan.  He recommends admission to the hospitalist service.  The patient has already received Zosyn.  The patient is on anticoagulation so we will not be able to have surgery for 2 days.  He will be admitted for further evaluation and management.       Durwin Glaze, MD 08/28/23 2130

## 2023-08-28 NOTE — ED Provider Notes (Signed)
 [place] EMERGENCY DEPARTMENT AT [facility] Provider Note   CSN: [id number] Arrival date & time: 08/28/23  4010     History {Add pertinent medical, surgical, social history, OB history to HPI:1} Chief Complaint  Patient presents with   Abdominal Pain    Calvin Fernandez is a 68 y.o. male.  Patient with a diagnosis of gallstone.  He was discharged few hours ago before coming back to the emergency department.  Patient states pain seems to be getting worse  The history is provided by the patient and medical records. No language interpreter was used.  Abdominal Pain Pain location:  Epigastric Pain quality: aching   Pain radiates to:  Does not radiate Pain severity:  Moderate Onset quality:  Sudden Timing:  Constant Progression:  Worsening Chronicity:  Recurrent Context: not alcohol use   Relieved by:  Nothing Associated symptoms: no chest pain, no cough, no diarrhea, no fatigue and no hematuria        Home Medications Prior to Admission medications   Medication Sig Start Date End Date Taking? Authorizing Provider  apixaban (ELIQUIS) 5 MG TABS tablet Take 1 tablet (5 mg total) by mouth 2 (two) times daily. 07/19/23   Fenton, Clint R, PA  diltiazem (CARDIZEM CD) 180 MG 24 hr capsule Take 1 capsule (180 mg total) by mouth daily. 07/29/23   Fenton, Clint R, PA  hydrochlorothiazide (HYDRODIURIL) 12.5 MG tablet Take 1 tablet (12.5 mg total) by mouth 2 (two) times daily. 07/15/23   Fenton, Clint R, PA  ondansetron (ZOFRAN-ODT) 4 MG disintegrating tablet 4mg  ODT q4 hours prn nausea/vomit 08/28/23   Pollina, Canary Brim, MD  oxyCODONE-acetaminophen (PERCOCET) 5-325 MG tablet Take 1-2 tablets by mouth every 4 (four) hours as needed. 08/28/23   Gilda Crease, MD  oxyCODONE-acetaminophen (PERCOCET/ROXICET) 5-325 MG tablet Take 1 tablet by mouth every 6 (six) hours as needed for severe pain (pain score 7-10). 08/28/23   Gilda Crease, MD  pantoprazole (PROTONIX)  40 MG tablet Take 1 tablet (40 mg total) by mouth 2 (two) times daily before a meal. 08/27/23   Mesner, Barbara Cower, MD  potassium chloride (KLOR-CON) 10 MEQ tablet Take 4 tablets (40 mEq total) by mouth daily for 1 day, THEN 1 tablet (10 mEq total) daily. 07/30/23 07/30/24  Fenton, Clint R, PA  sucralfate (CARAFATE) 1 GM/10ML suspension Take 10 mLs (1 g total) by mouth 4 (four) times daily -  with meals and at bedtime. 08/27/23   Mesner, Barbara Cower, MD      Allergies    Tetanus toxoid    Review of Systems   Review of Systems  Constitutional:  Negative for appetite change and fatigue.  HENT:  Negative for congestion, ear discharge and sinus pressure.   Eyes:  Negative for discharge.  Respiratory:  Negative for cough.   Cardiovascular:  Negative for chest pain.  Gastrointestinal:  Positive for abdominal pain. Negative for diarrhea.  Genitourinary:  Negative for frequency and hematuria.  Musculoskeletal:  Negative for back pain.  Skin:  Negative for rash.  Neurological:  Negative for seizures and headaches.  Psychiatric/Behavioral:  Negative for hallucinations.     Physical Exam Updated Vital Signs BP (!) 130/90   Pulse 61   Temp 98.3 F (36.8 C) (Oral)   Resp 16   Ht 6\' 2"  (1.88 m)   Wt 95.3 kg   SpO2 95%   BMI 26.96 kg/m  Physical Exam Vitals reviewed.  Constitutional:      Appearance: He  is well-developed.  HENT:     Head: Normocephalic.     Nose: Nose normal.  Eyes:     General: No scleral icterus.    Conjunctiva/sclera: Conjunctivae normal.  Neck:     Thyroid: No thyromegaly.  Cardiovascular:     Rate and Rhythm: Normal rate and regular rhythm.     Heart sounds: No murmur heard.    No friction rub. No gallop.  Pulmonary:     Breath sounds: No stridor. No wheezing or rales.  Chest:     Chest wall: No tenderness.  Abdominal:     General: There is no distension.     Tenderness: There is abdominal tenderness. There is no rebound.  Musculoskeletal:        General: Normal  range of motion.     Cervical back: Neck supple.  Lymphadenopathy:     Cervical: No cervical adenopathy.  Skin:    Findings: No erythema or rash.  Neurological:     Mental Status: He is alert and oriented to person, place, and time.     Motor: No abnormal muscle tone.     Coordination: Coordination normal.  Psychiatric:        Behavior: Behavior normal.     ED Results / Procedures / Treatments   Labs (all labs ordered are listed, but only abnormal results are displayed) Labs Reviewed  CBC WITH DIFFERENTIAL/PLATELET - Abnormal; Notable for the following components:      Result Value   WBC 14.8 (*)    Neutro Abs 12.0 (*)    Monocytes Absolute 1.5 (*)    All other components within normal limits  CBC WITH DIFFERENTIAL/PLATELET - Abnormal; Notable for the following components:   WBC 12.4 (*)    Neutro Abs 10.7 (*)    Lymphs Abs 0.4 (*)    Monocytes Absolute 1.2 (*)    All other components within normal limits  COMPREHENSIVE METABOLIC PANEL - Abnormal; Notable for the following components:   Chloride 96 (*)    Glucose, Bld 132 (*)    AST 574 (*)    ALT 526 (*)    Total Bilirubin 5.7 (*)    All other components within normal limits  LIPASE, BLOOD - Abnormal; Notable for the following components:   Lipase 91 (*)    All other components within normal limits  URINALYSIS, ROUTINE W REFLEX MICROSCOPIC - Abnormal; Notable for the following components:   Color, Urine AMBER (*)    APPearance HAZY (*)    Hgb urine dipstick SMALL (*)    Bilirubin Urine SMALL (*)    Protein, ur 30 (*)    Leukocytes,Ua TRACE (*)    All other components within normal limits    EKG None  Radiology DG Chest Port 1 View Result Date: 08/28/2023 CLINICAL DATA:  Shortness of breath.  Epigastric pain. EXAM: PORTABLE CHEST 1 VIEW COMPARISON:  08/27/2023. FINDINGS: Low lung volume. Bilateral lung fields are clear. Bilateral costophrenic angles are clear. Stable cardio-mediastinal silhouette. No acute  osseous abnormalities. The soft tissues are within normal limits. IMPRESSION: No active disease. Electronically Signed   By: Jules Schick M.D.   On: 08/28/2023 10:33   US Abdomen Limited RUQ (LIVER/GB) Result Date: 08/27/2023 CLINICAL DATA:  [id number] Abdominal pain [id number] EXAM: ULTRASOUND ABDOMEN LIMITED RIGHT UPPER QUADRANT COMPARISON:  None Available. FINDINGS: Gallbladder: Physiologically distended. No abnormal wall thickening. No pericholecystic free fluid. There is a single 2.5 cm calculus in the neck region. Sonographic Murphy's sign was negative  as per the technologist. Common bile duct: Diameter: Up to 5.4 mm.  No intrahepatic bile duct dilation. Liver: There is increased hepatic echogenicity which reduces the sensitivity of ultrasound for the detection of focal masses. That being said, no focal mass is identified. Portal vein is patent on color Doppler imaging with normal direction of blood flow towards the liver. Other: None. IMPRESSION: 1. There is a 2.5 cm gallstone without imaging signs of acute cholecystitis. 2. Increased hepatic echogenicity, a nonspecific finding that is most commonly seen on the basis of steatosis in the absence of known liver disease. 3. Otherwise unremarkable exam. Electronically Signed   By: Jules Schick M.D.   On: 08/27/2023 09:44   DG Chest 2 View Result Date: 08/27/2023 CLINICAL DATA:  Evaluation for pain EXAM: CHEST - 2 VIEW COMPARISON:  06/18/2023 FINDINGS: Stable cardiomediastinal silhouette. Low lung volumes accentuate pulmonary vascularity. No focal consolidation, pleural effusion, or pneumothorax. No displaced rib fractures. IMPRESSION: No active cardiopulmonary disease. Electronically Signed   By: Minerva Fester M.D.   On: 08/27/2023 03:54   CT ABDOMEN PELVIS W CONTRAST Result Date: 08/27/2023 CLINICAL DATA:  Epigastric abdominal pain EXAM: CT ABDOMEN AND PELVIS WITH CONTRAST TECHNIQUE: Multidetector CT imaging of the abdomen and pelvis was performed using  the standard protocol following bolus administration of intravenous contrast. RADIATION DOSE REDUCTION: This exam was performed according to the departmental dose-optimization program which includes automated exposure control, adjustment of the mA and/or kV according to patient size and/or use of iterative reconstruction technique. CONTRAST:  OMNIPAQUE IOHEXOL 300 MG/ML  SOLN COMPARISON:  03/13/2010 FINDINGS: Lower chest: No acute abnormality. Hepatobiliary: The gallbladder is distended, however common no superimposed acute inflammatory changes are identified. Mild hepatic steatosis. No enhancing intrahepatic mass. No intra or extrahepatic biliary ductal dilation. Pancreas: Unremarkable Spleen: Unremarkable Adrenals/Urinary Tract: The adrenal glands are unremarkable. The kidneys are normal in size and position. 5 mm nonobstructing calculus noted within the right kidney. The kidneys are otherwise unremarkable. No hydronephrosis. No ureteral calculi. The bladder is decompressed. Stomach/Bowel: Stomach is within normal limits. Appendix appears normal. No evidence of bowel wall thickening, distention, or inflammatory changes. Vascular/Lymphatic: Aortic atherosclerosis. No enlarged abdominal or pelvic lymph nodes. Reproductive: Moderate prostatic hypertrophy Other: No abdominal wall hernia or abnormality. No abdominopelvic ascites. Musculoskeletal: Status post right hip pinning. No acute bone abnormality. No lytic or blastic bone lesion. Osseous structures are age-appropriate. IMPRESSION: 1. No acute intra-abdominal pathology identified. No definite radiographic explanation for the patient's reported symptoms. 2. Mild hepatic steatosis. 3. 5 mm nonobstructing right renal calculus. No ureteral calculi. No hydronephrosis. 4. Moderate prostatic hypertrophy. Aortic Atherosclerosis (ICD10-I70.0). Electronically Signed   By: Helyn Numbers M.D.   On: 08/27/2023 03:48    Procedures Procedures  {Document cardiac  monitor, telemetry assessment procedure when appropriate:1}  Medications Ordered in ED Medications  piperacillin-tazobactam (ZOSYN) IVPB 3.375 g (3.375 g Intravenous New Bag/Given 08/28/23 1159)  HYDROmorphone (DILAUDID) injection 1 mg (1 mg Intravenous Given 08/28/23 0924)  ondansetron (ZOFRAN) injection 4 mg (4 mg Intravenous Given 08/28/23 0981)    ED Course/ Medical Decision Making/ A&P   {Patient's liver enzymes and lipase are elevated from just a few hours ago.  I spoke with Dr. Lovell Sheehan who came down to see the patient and he ordered an MRCP Click here for ABCD2, HEART and other calculatorsREFRESH Note before signing :1}  Medical Decision Making Amount and/or Complexity of Data Reviewed Labs: ordered. Radiology: ordered.  Risk Prescription drug management.   Abdominal pain from gallstones.  MRCP pending  {Document critical care time when appropriate:1} {Document review of labs and clinical decision tools ie heart score, Chads2Vasc2 etc:1}  {Document your independent review of radiology images, and any outside records:1} {Document your discussion with family members, caretakers, and with consultants:1} {Document social determinants of health affecting pt's care:1} {Document your decision making why or why not admission, treatments were needed:1} Final Clinical Impression(s) / ED Diagnoses Final diagnoses:  Pain of upper abdomen    Rx / DC Orders ED Discharge Orders     None

## 2023-08-28 NOTE — ED Triage Notes (Signed)
 Presents POV with c/o epigastric pain as well as pain in RLQ that started about 0230 this morning. States he is nauseated but has not vomited. States he was here the same time yesterday for the same issue.

## 2023-08-28 NOTE — H&P (Signed)
 History and Physical    Patient: Calvin Fernandez UJW:[id number] DOB: 13-Feb-1956 DOA: 08/28/2023 DOS: the patient was seen and examined on 08/29/2023 PCP: Benita Stabile, MD  Patient coming from: Home  Chief Complaint:  Chief Complaint  Patient presents with   Abdominal Pain   HPI: Calvin Fernandez is a 68 y.o. male with medical history significant of A-fib with RVR on Eliquis, GERD who presents to the emergency department due to right upper quadrant and epigastric pain that has been worsening.  He was seen in the ED earlier today and was noted to have gallstone in the gallbladder, at that time, patient's abdominal pain subsided and was discharged home.  On returning to the ED, liver enzymes have increased (this was only minimally elevated when first seen in the ED earlier today).  He denies fever, chills, nausea, vomiting  ED Course:  In the emergency department, vital signs were normal.  Workup in the ED showed normal CBC except for WBC of 12.4, BMP showed chloride of 96, blood glucose 132, AST 574, ALT 526, total bilirubin 5.7.  Lipase 91, urinalysis was unimpressive for UTI. MRI abdomen without and with contrast (including MRCP) showed  1. Distended gallbladder with extensive layering sludge and a 17 mm gallstone within the gallbladder neck. Trace submucosal edema within the gallbladder neck, trace pericholecystic inflammatory fluid, and trace inflammatory fluid within the right subhepatic space, in keeping with changes of early acute cholecystitis. 2. The distended cystic duct wraps anterior to the common hepatic duct and mass effect results in extrinsic compression and near occlusion of the extrahepatic bile duct. 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 Dilaudid was given for pain, Zofran was given, patient was started with Zosyn and IV hydration was provided. General surgery (Dr. Lovell Sheehan) was consulted and recommended admitting patient with  plan to follow-up on patient.  Review of Systems: Review of systems as noted in the HPI. All other systems reviewed and are negative.   Past Medical History:  Diagnosis Date   A-fib (HCC)    Chest pain 07/13/2009   mild aortic plaque on CT of abdomen; negative stress echocardiogram in 09/2011   GERD (gastroesophageal reflux disease)    Hypertension    multiple drug intolerances; noncompliance   Wears glasses    Past Surgical History:  Procedure Laterality Date   EXTENSOR TENDON OF FOREARM / WRIST REPAIR  1990   left   HIP PINNING,CANNULATED Right 11/12/2014   Procedure: PERCUTANEOUS PINNING RIGHT HIP ;  Surgeon: Gean Birchwood, MD;  Location: [facility];  Service: Orthopedics;  Laterality: Right;    Social History:  reports that he has never smoked. He has never used smokeless tobacco. He reports that he does not drink alcohol and does not use drugs.   Allergies  Allergen Reactions   Tetanus Toxoid Rash    Family History  Problem Relation Age of Onset   Heart attack Father 39     Prior to Admission medications   Medication Sig Start Date End Date Taking? Authorizing Provider  apixaban (ELIQUIS) 5 MG TABS tablet Take 1 tablet (5 mg total) by mouth 2 (two) times daily. 07/19/23  Yes Fenton, Clint R, PA  cetirizine (ZYRTEC) 10 MG tablet Take 10 mg by mouth daily.   Yes [provider]  diltiazem (CARDIZEM CD) 180 MG 24 hr capsule Take 1 capsule (180 mg total) by mouth daily. 07/29/23  Yes Fenton, Clint R, PA  hydrochlorothiazide (HYDRODIURIL)  12.5 MG tablet Take 1 tablet (12.5 mg total) by mouth 2 (two) times daily. 07/15/23  Yes Fenton, Clint R, PA  ondansetron (ZOFRAN-ODT) 4 MG disintegrating tablet 4mg  ODT q4 hours prn nausea/vomit Patient taking differently: Take 4 mg by mouth every 8 (eight) hours as needed for nausea or vomiting. 4mg  ODT q4 hours prn nausea/vomit 08/28/23  Yes Pollina, Canary Brim, MD  oxyCODONE-acetaminophen (PERCOCET) 5-325 MG tablet  Take 1-2 tablets by mouth every 4 (four) hours as needed. Patient taking differently: Take 1-2 tablets by mouth every 4 (four) hours as needed for moderate pain (pain score 4-6). 08/28/23  Yes Pollina, Canary Brim, MD  pantoprazole (PROTONIX) 40 MG tablet Take 1 tablet (40 mg total) by mouth 2 (two) times daily before a meal. 08/27/23   Mesner, Barbara Cower, MD  sucralfate (CARAFATE) 1 GM/10ML suspension Take 10 mLs (1 g total) by mouth 4 (four) times daily -  with meals and at bedtime. 08/27/23   Mesner, Barbara Cower, MD    Physical Exam: BP (!) 132/92 (BP Location: Left Arm)   Pulse 90   Temp 98.4 F (36.9 C) (Oral)   Resp 16   Ht 6\' 2"  (1.88 m)   Wt 95.3 kg   SpO2 93%   BMI 26.96 kg/m   General: 68 y.o. year-old male well developed well nourished in no acute distress.  Alert and oriented x3. HEENT: NCAT, EOMI Neck: Supple, trachea medial Cardiovascular: Regular rate and rhythm with no rubs or gallops.  No thyromegaly or JVD noted.  No lower extremity edema. 2/4 pulses in all 4 extremities. Respiratory: Clear to auscultation with no wheezes or rales. Good inspiratory effort. Abdomen: Soft, nontender nondistended with normal bowel sounds x4 quadrants. Muskuloskeletal: No cyanosis, clubbing or edema noted bilaterally Neuro: CN II-XII intact, strength 5/5 x 4, sensation, reflexes intact Skin: No ulcerative lesions noted or rashes Psychiatry: Judgement and insight appear normal. Mood is appropriate for condition and setting          Labs on Admission:  Basic Metabolic Panel: Recent Labs  Lab 08/27/23 0231 08/28/23 0343 08/28/23 1058  NA 139 134* 135  K 3.3* 3.1* 3.8  CL 98 99 96*  CO2 27 24 27   GLUCOSE 123* 144* 132*  BUN 19 14 16   CREATININE 1.08 0.92 0.96  CALCIUM 9.9 9.2 9.2   Liver Function Tests: Recent Labs  Lab 08/27/23 0231 08/28/23 0343 08/28/23 1058  AST 27 45* 574*  ALT 27 74* 526*  ALKPHOS 48 44 102  BILITOT 0.7 1.8* 5.7*  PROT 7.9 7.0 7.3  ALBUMIN 4.3 3.7 3.8    Recent Labs  Lab 08/27/23 0231 08/28/23 0343 08/28/23 1058  LIPASE 39 30 91*   No results for input(s): "AMMONIA" in the last 168 hours. CBC: Recent Labs  Lab 08/27/23 0231 08/28/23 0343 08/28/23 0929 08/28/23 1036  WBC 15.4* 13.2* 14.8* 12.4*  NEUTROABS 11.5* 10.4* 12.0* 10.7*  HGB 16.6 15.7 16.5 15.3  HCT 47.2 43.9 47.5 43.6  MCV 89.9 89.2 90.8 90.6  PLT 307 247 267 236   Cardiac Enzymes: No results for input(s): "CKTOTAL", "CKMB", "CKMBINDEX", "TROPONINI" in the last 168 hours.  BNP (last 3 results) No results for input(s): "BNP" in the last 8760 hours.  ProBNP (last 3 results) No results for input(s): "PROBNP" in the last 8760 hours.  CBG: No results for input(s): "GLUCAP" in the last 168 hours.  Radiological Exams on Admission: MR ABDOMEN MRCP W WO CONTAST Result Date: 08/28/2023 CLINICAL DATA:  Jaundice,  epigastric abdominal pain, cholelithiasis EXAM: MRI ABDOMEN WITHOUT AND WITH CONTRAST (INCLUDING MRCP) TECHNIQUE: Multiplanar multisequence MR imaging of the abdomen was performed both before and after the administration of intravenous contrast. Heavily T2-weighted images of the biliary and pancreatic ducts were obtained, and three-dimensional MRCP images were rendered by post processing. CONTRAST:  9.85mL GADAVIST GADOBUTROL 1 MMOL/ML IV SOLN COMPARISON:  CT 08/27/2023 FINDINGS: Lower chest: Bibasilar atelectasis.  No acute abnormality. Hepatobiliary: The gallbladder is distended. There is extensive layering sludge within the gallbladder as well as a 17 mm gallstone within the gallbladder neck. Sludge distends the gallbladder neck and cystic duct. There is trace submucosal edema within the gallbladder neck, best appreciated on image # 23/4, trace pericholecystic inflammatory fluid best seen adjacent to the fundus at axial image # 16/4 and within the right subhepatic space at image # 25-26/4, in keeping with changes of early acute cholecystitis. The gallbladder wall is  intact. The distended cystic duct wraps anterior to common hepatic duct and mass effect results in extrinsic compression and near occlusion of the extrahepatic bile duct, best appreciated on image # 17-20, series # for an MRCP image # 6, series # 12. 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 No significant intra or extrahepatic biliary ductal dilation. No intraluminal stone identified within the extrahepatic bile duct. Normal hepatic parenchymal echogenicity. No focal intrahepatic mass identified. The hepatic vasculature is unremarkable. Pancreas: The pancreas is atrophic. No focal intraparenchymal mass is identified. There are no acute peripancreatic inflammatory changes or fluid collections identified. The pancreatic duct is not dilated. There is moderate side branch ectasia noted within the body and tail of the pancreas which may reflect the sequela of remote pancreatitis. No pancreatic divisum. Spleen:  Within normal limits in size and appearance. Adrenals/Urinary Tract: The adrenal glands are unremarkable. The kidneys are normal in size and position. Mild bilateral nonspecific perinephric edema. Tiny simple cyst within the upper pole the left kidney for which no follow-up imaging is recommended. The kidneys are otherwise unremarkable. Stomach/Bowel: Visualized portions within the abdomen are unremarkable. Vascular/Lymphatic: No pathologically enlarged lymph nodes identified. No abdominal aortic aneurysm demonstrated. Other:  None. Musculoskeletal: Multiple intra osseous hemangioma are seen within the thoracolumbar spine. No suspicious lytic or blastic bone lesion. IMPRESSION: 1. Distended gallbladder with extensive layering sludge and a 17 mm gallstone within the gallbladder neck. Trace submucosal edema within the gallbladder neck, trace pericholecystic inflammatory fluid, and trace inflammatory fluid within the right subhepatic space, in keeping with changes of early  acute cholecystitis. 2. The distended cystic duct wraps anterior to the common hepatic duct and mass effect results in extrinsic compression and near occlusion of the extrahepatic bile duct. 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 No significant intra or extrahepatic biliary ductal dilation. No choledocholithiasis. 3. Atrophic pancreas with moderate side branch ectasia within the body and tail of the pancreas which may reflect the sequela of remote pancreatitis. No acute peripancreatic inflammatory changes or fluid collections identified. Electronically Signed   By: Helyn Numbers M.D.   On: 08/28/2023 20:44   MR 3D Recon At Scanner Result Date: 08/28/2023 CLINICAL DATA:  Jaundice, epigastric abdominal pain, cholelithiasis EXAM: MRI ABDOMEN WITHOUT AND WITH CONTRAST (INCLUDING MRCP) TECHNIQUE: Multiplanar multisequence MR imaging of the abdomen was performed both before and after the administration of intravenous contrast. Heavily T2-weighted images of the biliary and pancreatic ducts were obtained, and three-dimensional MRCP images were rendered by post processing. CONTRAST:  9.23mL GADAVIST GADOBUTROL 1 MMOL/ML IV SOLN COMPARISON:  CT 08/27/2023 FINDINGS: Lower chest: Bibasilar atelectasis.  No acute abnormality. Hepatobiliary: The gallbladder is distended. There is extensive layering sludge within the gallbladder as well as a 17 mm gallstone within the gallbladder neck. Sludge distends the gallbladder neck and cystic duct. There is trace submucosal edema within the gallbladder neck, best appreciated on image # 23/4, trace pericholecystic inflammatory fluid best seen adjacent to the fundus at axial image # 16/4 and within the right subhepatic space at image # 25-26/4, in keeping with changes of early acute cholecystitis. The gallbladder wall is intact. The distended cystic duct wraps anterior to common hepatic duct and mass effect results in extrinsic compression and  near occlusion of the extrahepatic bile duct, best appreciated on image # 17-20, series # for an MRCP image # 6, series # 12. 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 No significant intra or extrahepatic biliary ductal dilation. No intraluminal stone identified within the extrahepatic bile duct. Normal hepatic parenchymal echogenicity. No focal intrahepatic mass identified. The hepatic vasculature is unremarkable. Pancreas: The pancreas is atrophic. No focal intraparenchymal mass is identified. There are no acute peripancreatic inflammatory changes or fluid collections identified. The pancreatic duct is not dilated. There is moderate side branch ectasia noted within the body and tail of the pancreas which may reflect the sequela of remote pancreatitis. No pancreatic divisum. Spleen:  Within normal limits in size and appearance. Adrenals/Urinary Tract: The adrenal glands are unremarkable. The kidneys are normal in size and position. Mild bilateral nonspecific perinephric edema. Tiny simple cyst within the upper pole the left kidney for which no follow-up imaging is recommended. The kidneys are otherwise unremarkable. Stomach/Bowel: Visualized portions within the abdomen are unremarkable. Vascular/Lymphatic: No pathologically enlarged lymph nodes identified. No abdominal aortic aneurysm demonstrated. Other:  None. Musculoskeletal: Multiple intra osseous hemangioma are seen within the thoracolumbar spine. No suspicious lytic or blastic bone lesion. IMPRESSION: 1. Distended gallbladder with extensive layering sludge and a 17 mm gallstone within the gallbladder neck. Trace submucosal edema within the gallbladder neck, trace pericholecystic inflammatory fluid, and trace inflammatory fluid within the right subhepatic space, in keeping with changes of early acute cholecystitis. 2. The distended cystic duct wraps anterior to the common hepatic duct and mass effect results in  extrinsic compression and near occlusion of the extrahepatic bile duct. 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 No significant intra or extrahepatic biliary ductal dilation. No choledocholithiasis. 3. Atrophic pancreas with moderate side branch ectasia within the body and tail of the pancreas which may reflect the sequela of remote pancreatitis. No acute peripancreatic inflammatory changes or fluid collections identified. Electronically Signed   By: Helyn Numbers M.D.   On: 08/28/2023 20:44   DG Chest Port 1 View Result Date: 08/28/2023 CLINICAL DATA:  Shortness of breath.  Epigastric pain. EXAM: PORTABLE CHEST 1 VIEW COMPARISON:  08/27/2023. FINDINGS: Low lung volume. Bilateral lung fields are clear. Bilateral costophrenic angles are clear. Stable cardio-mediastinal silhouette. No acute osseous abnormalities. The soft tissues are within normal limits. IMPRESSION: No active disease. Electronically Signed   By: Jules Schick M.D.   On: 08/28/2023 10:33   US Abdomen Limited RUQ (LIVER/GB) Result Date: 08/27/2023 CLINICAL DATA:  [id number] Abdominal pain [id number] EXAM: ULTRASOUND ABDOMEN LIMITED RIGHT UPPER QUADRANT COMPARISON:  None Available. FINDINGS: Gallbladder: Physiologically distended. No abnormal wall thickening. No pericholecystic free fluid. There is a single 2.5 cm calculus in  the neck region. Sonographic Murphy's sign was negative as per the technologist. Common bile duct: Diameter: Up to 5.4 mm.  No intrahepatic bile duct dilation. Liver: There is increased hepatic echogenicity which reduces the sensitivity of ultrasound for the detection of focal masses. That being said, no focal mass is identified. Portal vein is patent on color Doppler imaging with normal direction of blood flow towards the liver. Other: None. IMPRESSION: 1. There is a 2.5 cm gallstone without imaging signs of acute cholecystitis. 2. Increased hepatic echogenicity, a nonspecific finding  that is most commonly seen on the basis of steatosis in the absence of known liver disease. 3. Otherwise unremarkable exam. Electronically Signed   By: Jules Schick M.D.   On: 08/27/2023 09:44   DG Chest 2 View Result Date: 08/27/2023 CLINICAL DATA:  Evaluation for pain EXAM: CHEST - 2 VIEW COMPARISON:  06/18/2023 FINDINGS: Stable cardiomediastinal silhouette. Low lung volumes accentuate pulmonary vascularity. No focal consolidation, pleural effusion, or pneumothorax. No displaced rib fractures. IMPRESSION: No active cardiopulmonary disease. Electronically Signed   By: Minerva Fester M.D.   On: 08/27/2023 03:54   CT ABDOMEN PELVIS W CONTRAST Result Date: 08/27/2023 CLINICAL DATA:  Epigastric abdominal pain EXAM: CT ABDOMEN AND PELVIS WITH CONTRAST TECHNIQUE: Multidetector CT imaging of the abdomen and pelvis was performed using the standard protocol following bolus administration of intravenous contrast. RADIATION DOSE REDUCTION: This exam was performed according to the departmental dose-optimization program which includes automated exposure control, adjustment of the mA and/or kV according to patient size and/or use of iterative reconstruction technique. CONTRAST:  OMNIPAQUE IOHEXOL 300 MG/ML  SOLN COMPARISON:  03/13/2010 FINDINGS: Lower chest: No acute abnormality. Hepatobiliary: The gallbladder is distended, however common no superimposed acute inflammatory changes are identified. Mild hepatic steatosis. No enhancing intrahepatic mass. No intra or extrahepatic biliary ductal dilation. Pancreas: Unremarkable Spleen: Unremarkable Adrenals/Urinary Tract: The adrenal glands are unremarkable. The kidneys are normal in size and position. 5 mm nonobstructing calculus noted within the right kidney. The kidneys are otherwise unremarkable. No hydronephrosis. No ureteral calculi. The bladder is decompressed. Stomach/Bowel: Stomach is within normal limits. Appendix appears normal. No evidence of bowel wall  thickening, distention, or inflammatory changes. Vascular/Lymphatic: Aortic atherosclerosis. No enlarged abdominal or pelvic lymph nodes. Reproductive: Moderate prostatic hypertrophy Other: No abdominal wall hernia or abnormality. No abdominopelvic ascites. Musculoskeletal: Status post right hip pinning. No acute bone abnormality. No lytic or blastic bone lesion. Osseous structures are age-appropriate. IMPRESSION: 1. No acute intra-abdominal pathology identified. No definite radiographic explanation for the patient's reported symptoms. 2. Mild hepatic steatosis. 3. 5 mm nonobstructing right renal calculus. No ureteral calculi. No hydronephrosis. 4. Moderate prostatic hypertrophy. Aortic Atherosclerosis (ICD10-I70.0). Electronically Signed   By: Helyn Numbers M.D.   On: 08/27/2023 03:48    EKG: I independently viewed the EKG done and my findings are as followed: EKG was not done in the ED  Assessment/Plan Present on Admission:  Acute cholecystitis  GERD (gastroesophageal reflux disease)  Principal Problem:   Acute cholecystitis Active Problems:   GERD (gastroesophageal reflux disease)   Calculus of gallbladder with acute cholecystitis and obstruction   Transaminitis   Elevated lipase   Chronic atrial fibrillation with RVR (HCC)  Acute cholecystitis Continue IV Dilaudid 0.5 mg every 3 hours as needed Continue Zofran as needed Continue IV hydration Continue Zosyn Blood culture pending Right upper quadrant ultrasound to be done in the morning Blood culture and sputum culture pending Patient was on Eliquis, this will be held at  this time General Surgery consult was appreciated.  Transaminitis  AST 574, ALT 526, total bilirubin 5.7 This may be due to above Continue to trend liver enzymes  Elevated lipase Lipase 91.  MRCP did not show pancreatitis Continue to monitor lipase levels  Chronic A-fib with RVR Eliquis will be held at this time due to impending surgical  intervention Continue Cardizem per home regimen  GERD Continue PPI  DVT prophylaxis: SCDs  Code Status: Full code  Family Communication: None at bedside  Consults: General Surgery  Severity of Illness: The appropriate patient status for this patient is INPATIENT. Inpatient status is judged to be reasonable and necessary in order to provide the required intensity of service to ensure the patient's safety. The patient's presenting symptoms, physical exam findings, and initial radiographic and laboratory data in the context of their chronic comorbidities is felt to place them at high risk for further clinical deterioration. Furthermore, it is not anticipated that the patient will be medically stable for discharge from the hospital within 2 midnights of admission.   * I certify that at the point of admission it is my clinical judgment that the patient will require inpatient hospital care spanning beyond 2 midnights from the point of admission due to high intensity of service, high risk for further deterioration and high frequency of surveillance required.*  Author: Frankey Shown, DO 08/29/2023 12:14 AM  For on call review [email or web address].

## 2023-08-28 NOTE — ED Notes (Signed)
 ED Provider at bedside.

## 2023-08-28 NOTE — ED Notes (Signed)
 ED TO INPATIENT HANDOFF REPORT  ED Nurse Name and Phone #:   S Name/Age/Gender Calvin Fernandez 68 y.o. male Room/Bed: APA14/APA14  Code Status   Code Status: Prior  Home/SNF/Other Home Patient oriented to: self, place, time, and situation Is this baseline? Yes   Triage Complete: Triage complete  Chief Complaint Acute cholecystitis [K81.0]  Triage Note Pt discharged this morning for upper abd pain, and presents with same. States he was recently diagnosed with gallstone, but unable to perform surgery d/t needing to hold Eliquis prior. Pt endorses nausea without vomiting.    Allergies Allergies  Allergen Reactions   Tetanus Toxoid Rash    Level of Care/Admitting Diagnosis ED Disposition     ED Disposition  Admit   Condition  --   Comment  Hospital Area: [facility] [[id number]]  Level of Care: Med-Surg [16]  Covid Evaluation: Asymptomatic - no recent exposure (last 10 days) testing not required  Diagnosis: Acute cholecystitis [575.0.ICD-9-CM]  Admitting Physician: Frankey Shown [[id number]]  Attending Physician: Frankey Shown [[id number]]  Certification:: I certify this patient will need inpatient services for at least 2 midnights  Expected Medical Readiness: 08/31/2023          B Medical/Surgery History Past Medical History:  Diagnosis Date   A-fib (HCC)    Chest pain 07/13/2009   mild aortic plaque on CT of abdomen; negative stress echocardiogram in 09/2011   GERD (gastroesophageal reflux disease)    Hypertension    multiple drug intolerances; noncompliance   Wears glasses    Past Surgical History:  Procedure Laterality Date   EXTENSOR TENDON OF FOREARM / WRIST REPAIR  1990   left   HIP PINNING,CANNULATED Right 11/12/2014   Procedure: PERCUTANEOUS PINNING RIGHT HIP ;  Surgeon: Gean Birchwood, MD;  Location: [facility];  Service: Orthopedics;  Laterality: Right;     A IV Location/Drains/Wounds Patient Lines/Drains/Airways Status      Active Line/Drains/Airways     Name Placement date Placement time Site Days   Peripheral IV 08/28/23 20 G 1" Anterior;Left Forearm 08/28/23  0918  Forearm  less than 1            Intake/Output Last 24 hours  Intake/Output Summary (Last 24 hours) at 08/28/2023 2242 Last data filed at 08/28/2023 2003 Gross per 24 hour  Intake 1049 ml  Output --  Net 1049 ml    Labs/Imaging Results for orders placed or performed during the hospital encounter of 08/28/23 (from the past 48 hours)  CBC with Differential     Status: Abnormal   Collection Time: 08/28/23  9:29 AM  Result Value Ref Range   WBC 14.8 (H) 4.0 - 10.5 K/uL   RBC 5.23 4.22 - 5.81 MIL/uL   Hemoglobin 16.5 13.0 - 17.0 g/dL   HCT 72.5 36.6 - 44.0 %   MCV 90.8 80.0 - 100.0 fL   MCH 31.5 26.0 - 34.0 pg   MCHC 34.7 30.0 - 36.0 g/dL   RDW 34.7 42.5 - 95.6 %   Platelets 267 150 - 400 K/uL   nRBC 0.0 0.0 - 0.2 %   Neutrophils Relative % 82 %   Neutro Abs 12.0 (H) 1.7 - 7.7 K/uL   Lymphocytes Relative 8 %   Lymphs Abs 1.1 0.7 - 4.0 K/uL   Monocytes Relative 10 %   Monocytes Absolute 1.5 (H) 0.1 - 1.0 K/uL   Eosinophils Relative 0 %   Eosinophils Absolute 0.1 0.0 - 0.5 K/uL   Basophils  Relative 0 %   Basophils Absolute 0.1 0.0 - 0.1 K/uL   Immature Granulocytes 0 %   Abs Immature Granulocytes 0.05 0.00 - 0.07 K/uL    Comment: Performed at [facility], [address], [place], [place] [address]  CBC with Differential/Platelet     Status: Abnormal   Collection Time: 08/28/23 10:36 AM  Result Value Ref Range   WBC 12.4 (H) 4.0 - 10.5 K/uL   RBC 4.81 4.22 - 5.81 MIL/uL   Hemoglobin 15.3 13.0 - 17.0 g/dL   HCT 16.0 10.9 - 32.3 %   MCV 90.6 80.0 - 100.0 fL   MCH 31.8 26.0 - 34.0 pg   MCHC 35.1 30.0 - 36.0 g/dL   RDW 55.7 32.2 - 02.5 %   Platelets 236 150 - 400 K/uL   nRBC 0.0 0.0 - 0.2 %   Neutrophils Relative % 87 %   Neutro Abs 10.7 (H) 1.7 - 7.7 K/uL   Lymphocytes Relative 3 %   Lymphs Abs 0.4 (L) 0.7 - 4.0 K/uL    Monocytes Relative 10 %   Monocytes Absolute 1.2 (H) 0.1 - 1.0 K/uL   Eosinophils Relative 0 %   Eosinophils Absolute 0.0 0.0 - 0.5 K/uL   Basophils Relative 0 %   Basophils Absolute 0.0 0.0 - 0.1 K/uL   Immature Granulocytes 0 %   Abs Immature Granulocytes 0.05 0.00 - 0.07 K/uL    Comment: Performed at [facility], [address], [place], [place] [address]  Comprehensive metabolic panel     Status: Abnormal   Collection Time: 08/28/23 10:58 AM  Result Value Ref Range   Sodium 135 135 - 145 mmol/L   Potassium 3.8 3.5 - 5.1 mmol/L   Chloride 96 (L) 98 - 111 mmol/L   CO2 27 22 - 32 mmol/L   Glucose, Bld 132 (H) 70 - 99 mg/dL    Comment: Glucose reference range applies only to samples taken after fasting for at least 8 hours.   BUN 16 8 - 23 mg/dL   Creatinine, Ser 2.37 0.61 - 1.24 mg/dL   Calcium 9.2 8.9 - 62.8 mg/dL   Total Protein 7.3 6.5 - 8.1 g/dL   Albumin 3.8 3.5 - 5.0 g/dL   AST 315 (H) 15 - 41 U/L   ALT 526 (H) 0 - 44 U/L   Alkaline Phosphatase 102 38 - 126 U/L   Total Bilirubin 5.7 (H) 0.0 - 1.2 mg/dL   GFR, Estimated >17 >61 mL/min    Comment: (NOTE) Calculated using the CKD-EPI Creatinine Equation (2021)    Anion gap 12 5 - 15    Comment: Performed at [facility], [address], [place], [place] [address]  Lipase, blood     Status: Abnormal   Collection Time: 08/28/23 10:58 AM  Result Value Ref Range   Lipase 91 (H) 11 - 51 U/L    Comment: Performed at [facility], [address], [place], [place] [address]  Urinalysis, Routine w reflex microscopic -Urine, Clean Catch     Status: Abnormal   Collection Time: 08/28/23  1:13 PM  Result Value Ref Range   Color, Urine AMBER (A) YELLOW    Comment: BIOCHEMICALS MAY BE AFFECTED BY COLOR   APPearance HAZY (A) CLEAR   Specific Gravity, Urine 1.025 1.005 - 1.030   pH 5.0 5.0 - 8.0   Glucose, UA NEGATIVE NEGATIVE mg/dL   Hgb urine dipstick SMALL (A) NEGATIVE   Bilirubin Urine SMALL (A) NEGATIVE   Ketones,  ur  NEGATIVE NEGATIVE mg/dL   Protein, ur 30 (A) NEGATIVE mg/dL   Nitrite NEGATIVE NEGATIVE   Leukocytes,Ua TRACE (A) NEGATIVE   RBC / HPF 6-10 0 - 5 RBC/hpf   WBC, UA 21-50 0 - 5 WBC/hpf   Bacteria, UA NONE SEEN NONE SEEN   Squamous Epithelial / HPF 0-5 0 - 5 /HPF   Mucus PRESENT     Comment: Performed at [facility], [address], [place], [place] [address]   MR ABDOMEN MRCP W WO CONTAST Result Date: 08/28/2023 CLINICAL DATA:  Jaundice, epigastric abdominal pain, cholelithiasis EXAM: MRI ABDOMEN WITHOUT AND WITH CONTRAST (INCLUDING MRCP) TECHNIQUE: Multiplanar multisequence MR imaging of the abdomen was performed both before and after the administration of intravenous contrast. Heavily T2-weighted images of the biliary and pancreatic ducts were obtained, and three-dimensional MRCP images were rendered by post processing. CONTRAST:  9.65mL GADAVIST GADOBUTROL 1 MMOL/ML IV SOLN COMPARISON:  CT 08/27/2023 FINDINGS: Lower chest: Bibasilar atelectasis.  No acute abnormality. Hepatobiliary: The gallbladder is distended. There is extensive layering sludge within the gallbladder as well as a 17 mm gallstone within the gallbladder neck. Sludge distends the gallbladder neck and cystic duct. There is trace submucosal edema within the gallbladder neck, best appreciated on image # 23/4, trace pericholecystic inflammatory fluid best seen adjacent to the fundus at axial image # 16/4 and within the right subhepatic space at image # 25-26/4, in keeping with changes of early acute cholecystitis. The gallbladder wall is intact. The distended cystic duct wraps anterior to common hepatic duct and mass effect results in extrinsic compression and near occlusion of the extrahepatic bile duct, best appreciated on image # 17-20, series # for an MRCP image # 6, series # 12. 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 No significant intra or extrahepatic biliary ductal dilation. No  intraluminal stone identified within the extrahepatic bile duct. Normal hepatic parenchymal echogenicity. No focal intrahepatic mass identified. The hepatic vasculature is unremarkable. Pancreas: The pancreas is atrophic. No focal intraparenchymal mass is identified. There are no acute peripancreatic inflammatory changes or fluid collections identified. The pancreatic duct is not dilated. There is moderate side branch ectasia noted within the body and tail of the pancreas which may reflect the sequela of remote pancreatitis. No pancreatic divisum. Spleen:  Within normal limits in size and appearance. Adrenals/Urinary Tract: The adrenal glands are unremarkable. The kidneys are normal in size and position. Mild bilateral nonspecific perinephric edema. Tiny simple cyst within the upper pole the left kidney for which no follow-up imaging is recommended. The kidneys are otherwise unremarkable. Stomach/Bowel: Visualized portions within the abdomen are unremarkable. Vascular/Lymphatic: No pathologically enlarged lymph nodes identified. No abdominal aortic aneurysm demonstrated. Other:  None. Musculoskeletal: Multiple intra osseous hemangioma are seen within the thoracolumbar spine. No suspicious lytic or blastic bone lesion. IMPRESSION: 1. Distended gallbladder with extensive layering sludge and a 17 mm gallstone within the gallbladder neck. Trace submucosal edema within the gallbladder neck, trace pericholecystic inflammatory fluid, and trace inflammatory fluid within the right subhepatic space, in keeping with changes of early acute cholecystitis. 2. The distended cystic duct wraps anterior to the common hepatic duct and mass effect results in extrinsic compression and near occlusion of the extrahepatic bile duct. 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 No significant intra or extrahepatic biliary ductal dilation. No choledocholithiasis. 3. Atrophic pancreas with moderate  side branch ectasia within the body and tail of the pancreas which  may reflect the sequela of remote pancreatitis. No acute peripancreatic inflammatory changes or fluid collections identified. Electronically Signed   By: Helyn Numbers M.D.   On: 08/28/2023 20:44   MR 3D Recon At Scanner Result Date: 08/28/2023 CLINICAL DATA:  Jaundice, epigastric abdominal pain, cholelithiasis EXAM: MRI ABDOMEN WITHOUT AND WITH CONTRAST (INCLUDING MRCP) TECHNIQUE: Multiplanar multisequence MR imaging of the abdomen was performed both before and after the administration of intravenous contrast. Heavily T2-weighted images of the biliary and pancreatic ducts were obtained, and three-dimensional MRCP images were rendered by post processing. CONTRAST:  9.77mL GADAVIST GADOBUTROL 1 MMOL/ML IV SOLN COMPARISON:  CT 08/27/2023 FINDINGS: Lower chest: Bibasilar atelectasis.  No acute abnormality. Hepatobiliary: The gallbladder is distended. There is extensive layering sludge within the gallbladder as well as a 17 mm gallstone within the gallbladder neck. Sludge distends the gallbladder neck and cystic duct. There is trace submucosal edema within the gallbladder neck, best appreciated on image # 23/4, trace pericholecystic inflammatory fluid best seen adjacent to the fundus at axial image # 16/4 and within the right subhepatic space at image # 25-26/4, in keeping with changes of early acute cholecystitis. The gallbladder wall is intact. The distended cystic duct wraps anterior to common hepatic duct and mass effect results in extrinsic compression and near occlusion of the extrahepatic bile duct, best appreciated on image # 17-20, series # for an MRCP image # 6, series # 12. 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 No significant intra or extrahepatic biliary ductal dilation. No intraluminal stone identified within the extrahepatic bile duct. Normal hepatic parenchymal echogenicity. No focal  intrahepatic mass identified. The hepatic vasculature is unremarkable. Pancreas: The pancreas is atrophic. No focal intraparenchymal mass is identified. There are no acute peripancreatic inflammatory changes or fluid collections identified. The pancreatic duct is not dilated. There is moderate side branch ectasia noted within the body and tail of the pancreas which may reflect the sequela of remote pancreatitis. No pancreatic divisum. Spleen:  Within normal limits in size and appearance. Adrenals/Urinary Tract: The adrenal glands are unremarkable. The kidneys are normal in size and position. Mild bilateral nonspecific perinephric edema. Tiny simple cyst within the upper pole the left kidney for which no follow-up imaging is recommended. The kidneys are otherwise unremarkable. Stomach/Bowel: Visualized portions within the abdomen are unremarkable. Vascular/Lymphatic: No pathologically enlarged lymph nodes identified. No abdominal aortic aneurysm demonstrated. Other:  None. Musculoskeletal: Multiple intra osseous hemangioma are seen within the thoracolumbar spine. No suspicious lytic or blastic bone lesion. IMPRESSION: 1. Distended gallbladder with extensive layering sludge and a 17 mm gallstone within the gallbladder neck. Trace submucosal edema within the gallbladder neck, trace pericholecystic inflammatory fluid, and trace inflammatory fluid within the right subhepatic space, in keeping with changes of early acute cholecystitis. 2. The distended cystic duct wraps anterior to the common hepatic duct and mass effect results in extrinsic compression and near occlusion of the extrahepatic bile duct. 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 No significant intra or extrahepatic biliary ductal dilation. No choledocholithiasis. 3. Atrophic pancreas with moderate side branch ectasia within the body and tail of the pancreas which may reflect the sequela of remote pancreatitis. No  acute peripancreatic inflammatory changes or fluid collections identified. Electronically Signed   By: Helyn Numbers M.D.   On: 08/28/2023 20:44   DG Chest Port 1 View Result Date: 08/28/2023 CLINICAL DATA:  Shortness of breath.  Epigastric pain. EXAM: PORTABLE CHEST 1  VIEW COMPARISON:  08/27/2023. FINDINGS: Low lung volume. Bilateral lung fields are clear. Bilateral costophrenic angles are clear. Stable cardio-mediastinal silhouette. No acute osseous abnormalities. The soft tissues are within normal limits. IMPRESSION: No active disease. Electronically Signed   By: Jules Schick M.D.   On: 08/28/2023 10:33   US Abdomen Limited RUQ (LIVER/GB) Result Date: 08/27/2023 CLINICAL DATA:  [id number] Abdominal pain [id number] EXAM: ULTRASOUND ABDOMEN LIMITED RIGHT UPPER QUADRANT COMPARISON:  None Available. FINDINGS: Gallbladder: Physiologically distended. No abnormal wall thickening. No pericholecystic free fluid. There is a single 2.5 cm calculus in the neck region. Sonographic Murphy's sign was negative as per the technologist. Common bile duct: Diameter: Up to 5.4 mm.  No intrahepatic bile duct dilation. Liver: There is increased hepatic echogenicity which reduces the sensitivity of ultrasound for the detection of focal masses. That being said, no focal mass is identified. Portal vein is patent on color Doppler imaging with normal direction of blood flow towards the liver. Other: None. IMPRESSION: 1. There is a 2.5 cm gallstone without imaging signs of acute cholecystitis. 2. Increased hepatic echogenicity, a nonspecific finding that is most commonly seen on the basis of steatosis in the absence of known liver disease. 3. Otherwise unremarkable exam. Electronically Signed   By: Jules Schick M.D.   On: 08/27/2023 09:44   DG Chest 2 View Result Date: 08/27/2023 CLINICAL DATA:  Evaluation for pain EXAM: CHEST - 2 VIEW COMPARISON:  06/18/2023 FINDINGS: Stable cardiomediastinal silhouette. Low lung volumes accentuate  pulmonary vascularity. No focal consolidation, pleural effusion, or pneumothorax. No displaced rib fractures. IMPRESSION: No active cardiopulmonary disease. Electronically Signed   By: Minerva Fester M.D.   On: 08/27/2023 03:54   CT ABDOMEN PELVIS W CONTRAST Result Date: 08/27/2023 CLINICAL DATA:  Epigastric abdominal pain EXAM: CT ABDOMEN AND PELVIS WITH CONTRAST TECHNIQUE: Multidetector CT imaging of the abdomen and pelvis was performed using the standard protocol following bolus administration of intravenous contrast. RADIATION DOSE REDUCTION: This exam was performed according to the departmental dose-optimization program which includes automated exposure control, adjustment of the mA and/or kV according to patient size and/or use of iterative reconstruction technique. CONTRAST:  OMNIPAQUE IOHEXOL 300 MG/ML  SOLN COMPARISON:  03/13/2010 FINDINGS: Lower chest: No acute abnormality. Hepatobiliary: The gallbladder is distended, however common no superimposed acute inflammatory changes are identified. Mild hepatic steatosis. No enhancing intrahepatic mass. No intra or extrahepatic biliary ductal dilation. Pancreas: Unremarkable Spleen: Unremarkable Adrenals/Urinary Tract: The adrenal glands are unremarkable. The kidneys are normal in size and position. 5 mm nonobstructing calculus noted within the right kidney. The kidneys are otherwise unremarkable. No hydronephrosis. No ureteral calculi. The bladder is decompressed. Stomach/Bowel: Stomach is within normal limits. Appendix appears normal. No evidence of bowel wall thickening, distention, or inflammatory changes. Vascular/Lymphatic: Aortic atherosclerosis. No enlarged abdominal or pelvic lymph nodes. Reproductive: Moderate prostatic hypertrophy Other: No abdominal wall hernia or abnormality. No abdominopelvic ascites. Musculoskeletal: Status post right hip pinning. No acute bone abnormality. No lytic or blastic bone lesion. Osseous structures are  age-appropriate. IMPRESSION: 1. No acute intra-abdominal pathology identified. No definite radiographic explanation for the patient's reported symptoms. 2. Mild hepatic steatosis. 3. 5 mm nonobstructing right renal calculus. No ureteral calculi. No hydronephrosis. 4. Moderate prostatic hypertrophy. Aortic Atherosclerosis (ICD10-I70.0). Electronically Signed   By: Helyn Numbers M.D.   On: 08/27/2023 03:48    Pending Labs Unresulted Labs (From admission, onward)    None       Vitals/Pain Today's Vitals   08/28/23 2100 08/28/23  2200 08/28/23 2207 08/28/23 2230  BP: 126/80 (!) 137/102  132/86  Pulse: 60 74  73  Resp: 17 18  18   Temp:  98.7 F (37.1 C)    TempSrc:      SpO2: 95% 95%  95%  Weight:      Height:      PainSc:   2      Isolation Precautions No active isolations  Medications Medications  piperacillin-tazobactam (ZOSYN) IVPB 3.375 g (3.375 g Intravenous New Bag/Given 08/28/23 2158)  HYDROmorphone (DILAUDID) injection 1 mg (1 mg Intravenous Given 08/28/23 0924)  ondansetron (ZOFRAN) injection 4 mg (4 mg Intravenous Given 08/28/23 0923)  gadobutrol (GADAVIST) 1 MMOL/ML injection 9.5 mL (9.5 mLs Intravenous Contrast Given 08/28/23 1555)  sodium chloride 0.9 % bolus 1,000 mL (0 mLs Intravenous Stopped 08/28/23 2003)  HYDROmorphone (DILAUDID) injection 1 mg (1 mg Intravenous Given 08/28/23 2003)  ondansetron (ZOFRAN) injection 4 mg (4 mg Intravenous Given 08/28/23 1956)    Mobility walks     Focused Assessments    R Recommendations: See Admitting Provider Note  Report given to:   Additional Notes:

## 2023-08-28 NOTE — ED Provider Notes (Signed)
 [place] EMERGENCY DEPARTMENT AT [facility] Provider Note   CSN: [id number] Arrival date & time: 08/28/23  0304     History  Chief Complaint  Patient presents with   Abdominal Pain    Calvin Fernandez is a 68 y.o. male.  Patient returns with abdominal pain.  Patient reports that Calvin Fernandez was in the ED yesterday with similar pain and was diagnosed with a gallstone.  Calvin Fernandez has developed nausea and right upper quadrant abdominal pain with some pain in the right groin area as well.       Home Medications Prior to Admission medications   Medication Sig Start Date End Date Taking? Authorizing Provider  ondansetron (ZOFRAN-ODT) 4 MG disintegrating tablet 4mg  ODT q4 hours prn nausea/vomit 08/28/23  Yes Tikisha Molinaro, Canary Brim, MD  oxyCODONE-acetaminophen (PERCOCET) 5-325 MG tablet Take 1-2 tablets by mouth every 4 (four) hours as needed. 08/28/23  Yes Ayari Liwanag, Canary Brim, MD  oxyCODONE-acetaminophen (PERCOCET/ROXICET) 5-325 MG tablet Take 1 tablet by mouth every 6 (six) hours as needed for severe pain (pain score 7-10). 08/28/23  Yes Gerrell Tabet, Canary Brim, MD  apixaban (ELIQUIS) 5 MG TABS tablet Take 1 tablet (5 mg total) by mouth 2 (two) times daily. 07/19/23   Fenton, Clint R, PA  diltiazem (CARDIZEM CD) 180 MG 24 hr capsule Take 1 capsule (180 mg total) by mouth daily. 07/29/23   Fenton, Clint R, PA  hydrochlorothiazide (HYDRODIURIL) 12.5 MG tablet Take 1 tablet (12.5 mg total) by mouth 2 (two) times daily. 07/15/23   Fenton, Clint R, PA  pantoprazole (PROTONIX) 40 MG tablet Take 1 tablet (40 mg total) by mouth 2 (two) times daily before a meal. 08/27/23   Mesner, Barbara Cower, MD  potassium chloride (KLOR-CON) 10 MEQ tablet Take 4 tablets (40 mEq total) by mouth daily for 1 day, THEN 1 tablet (10 mEq total) daily. 07/30/23 07/30/24  Fenton, Clint R, PA  sucralfate (CARAFATE) 1 GM/10ML suspension Take 10 mLs (1 g total) by mouth 4 (four) times daily -  with meals and at bedtime. 08/27/23   Mesner,  Barbara Cower, MD      Allergies    Tetanus toxoid    Review of Systems   Review of Systems  Physical Exam Updated Vital Signs BP 121/81   Pulse 70   Temp 98.6 F (37 C) (Oral)   Resp 11   Ht 6\' 2"  (1.88 m)   Wt 95.3 kg   SpO2 93%   BMI 26.96 kg/m  Physical Exam Vitals and nursing note reviewed.  Constitutional:      General: Calvin Fernandez is not in acute distress.    Appearance: Calvin Fernandez is well-developed.  HENT:     Head: Normocephalic and atraumatic.     Mouth/Throat:     Mouth: Mucous membranes are moist.  Eyes:     General: Vision grossly intact. Gaze aligned appropriately.     Extraocular Movements: Extraocular movements intact.     Conjunctiva/sclera: Conjunctivae normal.  Cardiovascular:     Rate and Rhythm: Normal rate and regular rhythm.     Pulses: Normal pulses.     Heart sounds: Normal heart sounds, S1 normal and S2 normal. No murmur heard.    No friction rub. No gallop.  Pulmonary:     Effort: Pulmonary effort is normal. No respiratory distress.     Breath sounds: Normal breath sounds.  Abdominal:     Palpations: Abdomen is soft.     Tenderness: There is abdominal tenderness in the right upper  quadrant. There is no guarding or rebound.     Hernia: No hernia is present.  Musculoskeletal:        General: No swelling.     Cervical back: Full passive range of motion without pain, normal range of motion and neck supple. No pain with movement, spinous process tenderness or muscular tenderness. Normal range of motion.     Right lower leg: No edema.     Left lower leg: No edema.  Skin:    General: Skin is warm and dry.     Capillary Refill: Capillary refill takes less than 2 seconds.     Findings: No ecchymosis, erythema, lesion or wound.  Neurological:     Mental Status: Calvin Fernandez is alert and oriented to person, place, and time.     GCS: GCS eye subscore is 4. GCS verbal subscore is 5. GCS motor subscore is 6.     Cranial Nerves: Cranial nerves 2-12 are intact.     Sensory:  Sensation is intact.     Motor: Motor function is intact. No weakness or abnormal muscle tone.     Coordination: Coordination is intact.  Psychiatric:        Mood and Affect: Mood normal.        Speech: Speech normal.        Behavior: Behavior normal.     ED Results / Procedures / Treatments   Labs (all labs ordered are listed, but only abnormal results are displayed) Labs Reviewed  CBC WITH DIFFERENTIAL/PLATELET - Abnormal; Notable for the following components:      Result Value   WBC 13.2 (*)    Neutro Abs 10.4 (*)    Monocytes Absolute 1.2 (*)    All other components within normal limits  COMPREHENSIVE METABOLIC PANEL - Abnormal; Notable for the following components:   Sodium 134 (*)    Potassium 3.1 (*)    Glucose, Bld 144 (*)    AST 45 (*)    ALT 74 (*)    Total Bilirubin 1.8 (*)    All other components within normal limits  LIPASE, BLOOD    EKG None  Radiology US Abdomen Limited RUQ (LIVER/GB) Result Date: 08/27/2023 CLINICAL DATA:  [id number] Abdominal pain [id number] EXAM: ULTRASOUND ABDOMEN LIMITED RIGHT UPPER QUADRANT COMPARISON:  None Available. FINDINGS: Gallbladder: Physiologically distended. No abnormal wall thickening. No pericholecystic free fluid. There is a single 2.5 cm calculus in the neck region. Sonographic Murphy's sign was negative as per the technologist. Common bile duct: Diameter: Up to 5.4 mm.  No intrahepatic bile duct dilation. Liver: There is increased hepatic echogenicity which reduces the sensitivity of ultrasound for the detection of focal masses. That being said, no focal mass is identified. Portal vein is patent on color Doppler imaging with normal direction of blood flow towards the liver. Other: None. IMPRESSION: 1. There is a 2.5 cm gallstone without imaging signs of acute cholecystitis. 2. Increased hepatic echogenicity, a nonspecific finding that is most commonly seen on the basis of steatosis in the absence of known liver disease. 3. Otherwise  unremarkable exam. Electronically Signed   By: Jules Schick M.D.   On: 08/27/2023 09:44   DG Chest 2 View Result Date: 08/27/2023 CLINICAL DATA:  Evaluation for pain EXAM: CHEST - 2 VIEW COMPARISON:  06/18/2023 FINDINGS: Stable cardiomediastinal silhouette. Low lung volumes accentuate pulmonary vascularity. No focal consolidation, pleural effusion, or pneumothorax. No displaced rib fractures. IMPRESSION: No active cardiopulmonary disease. Electronically Signed   By: Minerva Fester  M.D.   On: 08/27/2023 03:54   CT ABDOMEN PELVIS W CONTRAST Result Date: 08/27/2023 CLINICAL DATA:  Epigastric abdominal pain EXAM: CT ABDOMEN AND PELVIS WITH CONTRAST TECHNIQUE: Multidetector CT imaging of the abdomen and pelvis was performed using the standard protocol following bolus administration of intravenous contrast. RADIATION DOSE REDUCTION: This exam was performed according to the departmental dose-optimization program which includes automated exposure control, adjustment of the mA and/or kV according to patient size and/or use of iterative reconstruction technique. CONTRAST:  OMNIPAQUE IOHEXOL 300 MG/ML  SOLN COMPARISON:  03/13/2010 FINDINGS: Lower chest: No acute abnormality. Hepatobiliary: The gallbladder is distended, however common no superimposed acute inflammatory changes are identified. Mild hepatic steatosis. No enhancing intrahepatic mass. No intra or extrahepatic biliary ductal dilation. Pancreas: Unremarkable Spleen: Unremarkable Adrenals/Urinary Tract: The adrenal glands are unremarkable. The kidneys are normal in size and position. 5 mm nonobstructing calculus noted within the right kidney. The kidneys are otherwise unremarkable. No hydronephrosis. No ureteral calculi. The bladder is decompressed. Stomach/Bowel: Stomach is within normal limits. Appendix appears normal. No evidence of bowel wall thickening, distention, or inflammatory changes. Vascular/Lymphatic: Aortic atherosclerosis. No enlarged  abdominal or pelvic lymph nodes. Reproductive: Moderate prostatic hypertrophy Other: No abdominal wall hernia or abnormality. No abdominopelvic ascites. Musculoskeletal: Status post right hip pinning. No acute bone abnormality. No lytic or blastic bone lesion. Osseous structures are age-appropriate. IMPRESSION: 1. No acute intra-abdominal pathology identified. No definite radiographic explanation for the patient's reported symptoms. 2. Mild hepatic steatosis. 3. 5 mm nonobstructing right renal calculus. No ureteral calculi. No hydronephrosis. 4. Moderate prostatic hypertrophy. Aortic Atherosclerosis (ICD10-I70.0). Electronically Signed   By: Helyn Numbers M.D.   On: 08/27/2023 03:48    Procedures Procedures    Medications Ordered in ED Medications  HYDROmorphone (DILAUDID) injection 1 mg (1 mg Intravenous Given 08/28/23 0344)  ondansetron (ZOFRAN) injection 4 mg (4 mg Intravenous Given 08/28/23 0343)    ED Course/ Medical Decision Making/ A&P                                 Medical Decision Making Amount and/or Complexity of Data Reviewed Labs: ordered.  Risk Prescription drug management.   Records reviewed.  Patient did have gallstone confirmed by ultrasound.  CT scan did not show any other acute intra-abdominal pathology.  Does not require repeat imaging.  Patient given a dose of Dilaudid with significant improvement of his pain.  Calvin Fernandez does have very slight elevation of AST, ALT and total bilirubin.  Patient is, however, on Eliquis for paroxysmal atrial fibrillation.  Surgery would therefore need to be delayed based on this.  Discussed with Dr. Lovell Sheehan, on-call for general surgery.  Calvin Fernandez feels that it is safe to discharge the patient and have him call the office Monday morning for quick follow-up to schedule outpatient.  Calvin Fernandez will be given return precautions.  If Calvin Fernandez returns again, will need to be admitted.        Final Clinical Impression(s) / ED Diagnoses Final diagnoses:  Biliary  colic    Rx / DC Orders ED Discharge Orders          Ordered    oxyCODONE-acetaminophen (PERCOCET) 5-325 MG tablet  Every 4 hours PRN        08/28/23 0536    ondansetron (ZOFRAN-ODT) 4 MG disintegrating tablet        08/28/23 0536    oxyCODONE-acetaminophen (PERCOCET/ROXICET) 5-325 MG tablet  Every 6  hours PRN        08/28/23 0537              Gilda Crease, MD 08/28/23 [phone]

## 2023-08-28 NOTE — ED Notes (Signed)
 Patient takes BP medicine in the morning and evening. Patient took it this morning, but hasn't this evening.

## 2023-08-28 NOTE — ED Notes (Signed)
 Patient transported to MRI

## 2023-08-29 ENCOUNTER — Inpatient Hospital Stay (HOSPITAL_COMMUNITY): Payer: Medicare PPO

## 2023-08-29 DIAGNOSIS — I482 Chronic atrial fibrillation, unspecified: Secondary | ICD-10-CM | POA: Diagnosis not present

## 2023-08-29 DIAGNOSIS — K8001 Calculus of gallbladder with acute cholecystitis with obstruction: Secondary | ICD-10-CM

## 2023-08-29 DIAGNOSIS — R748 Abnormal levels of other serum enzymes: Secondary | ICD-10-CM | POA: Diagnosis not present

## 2023-08-29 DIAGNOSIS — R7401 Elevation of levels of liver transaminase levels: Secondary | ICD-10-CM | POA: Insufficient documentation

## 2023-08-29 LAB — COMPREHENSIVE METABOLIC PANEL
ALT: 615 U/L — ABNORMAL HIGH (ref 0–44)
AST: 404 U/L — ABNORMAL HIGH (ref 15–41)
Albumin: 3.4 g/dL — ABNORMAL LOW (ref 3.5–5.0)
Alkaline Phosphatase: 128 U/L — ABNORMAL HIGH (ref 38–126)
Anion gap: 13 (ref 5–15)
BUN: 15 mg/dL (ref 8–23)
CO2: 24 mmol/L (ref 22–32)
Calcium: 8.8 mg/dL — ABNORMAL LOW (ref 8.9–10.3)
Chloride: 98 mmol/L (ref 98–111)
Creatinine, Ser: 0.91 mg/dL (ref 0.61–1.24)
GFR, Estimated: >60 mL/min (ref >60)
Glucose, Bld: 99 mg/dL (ref 70–99)
Potassium: 3.4 mmol/L — ABNORMAL LOW (ref 3.5–5.1)
Sodium: 135 mmol/L (ref 135–145)
Total Bilirubin: 8.1 mg/dL — ABNORMAL HIGH (ref 0.0–1.2)
Total Protein: 6.7 g/dL (ref 6.5–8.1)

## 2023-08-29 LAB — CBC
HCT: 41.8 % (ref 39.0–52.0)
Hemoglobin: 14.3 g/dL (ref 13.0–17.0)
MCH: 31.9 pg (ref 26.0–34.0)
MCHC: 34.2 g/dL (ref 30.0–36.0)
MCV: 93.3 fL (ref 80.0–100.0)
Platelets: 225 10*3/uL (ref 150–400)
RBC: 4.48 MIL/uL (ref 4.22–5.81)
RDW: 12.9 % (ref 11.5–15.5)
WBC: 16.2 10*3/uL — ABNORMAL HIGH (ref 4.0–10.5)
nRBC: 0 % (ref 0.0–0.2)

## 2023-08-29 LAB — HIV ANTIBODY (ROUTINE TESTING W REFLEX): HIV Screen 4th Generation wRfx: NONREACTIVE

## 2023-08-29 LAB — PHOSPHORUS: Phosphorus: 2.7 mg/dL (ref 2.5–4.6)

## 2023-08-29 LAB — MAGNESIUM: Magnesium: 2 mg/dL (ref 1.7–2.4)

## 2023-08-29 MED ORDER — ONDANSETRON HCL 4 MG PO TABS: 4.0000 mg | ORAL_TABLET | Freq: Four times a day (QID) | ORAL | Status: DC | PRN

## 2023-08-29 MED ORDER — PANTOPRAZOLE SODIUM 40 MG PO TBEC
40.0000 mg | DELAYED_RELEASE_TABLET | Freq: Two times a day (BID) | ORAL | Status: DC
  Administered 2023-08-29 – 2023-09-01 (×5): 40 mg via ORAL
  Filled 2023-08-29 (×5): qty 1

## 2023-08-29 MED ORDER — POTASSIUM CHLORIDE CRYS ER 20 MEQ PO TBCR
20.0000 meq | EXTENDED_RELEASE_TABLET | Freq: Once | ORAL | Status: AC
  Administered 2023-08-29: 20 meq via ORAL
  Filled 2023-08-29: qty 1

## 2023-08-29 MED ORDER — HYDRALAZINE HCL 20 MG/ML IJ SOLN: 5.0000 mg | INTRAMUSCULAR | Status: DC | PRN

## 2023-08-29 MED ORDER — DILTIAZEM HCL ER COATED BEADS 180 MG PO CP24
180.0000 mg | ORAL_CAPSULE | Freq: Every day | ORAL | Status: DC
  Administered 2023-08-29 – 2023-09-01 (×3): 180 mg via ORAL
  Filled 2023-08-29 (×4): qty 1

## 2023-08-29 MED ORDER — ACETAMINOPHEN 650 MG RE SUPP: 650.0000 mg | Freq: Four times a day (QID) | RECTAL | Status: DC | PRN

## 2023-08-29 MED ORDER — LACTATED RINGERS IV SOLN: INTRAVENOUS | Status: AC

## 2023-08-29 MED ORDER — ACETAMINOPHEN 325 MG PO TABS
650.0000 mg | ORAL_TABLET | Freq: Four times a day (QID) | ORAL | Status: DC | PRN
  Administered 2023-08-29 (×2): 650 mg via ORAL
  Filled 2023-08-29 (×2): qty 2

## 2023-08-29 MED ORDER — POTASSIUM CHLORIDE CRYS ER 20 MEQ PO TBCR: 20.0000 meq | EXTENDED_RELEASE_TABLET | Freq: Three times a day (TID) | ORAL | Status: DC

## 2023-08-29 MED ORDER — KETOROLAC TROMETHAMINE 30 MG/ML IJ SOLN
30.0000 mg | Freq: Three times a day (TID) | INTRAMUSCULAR | Status: DC | PRN
  Administered 2023-08-29: 30 mg via INTRAVENOUS
  Filled 2023-08-29: qty 1

## 2023-08-29 MED ORDER — POTASSIUM CHLORIDE CRYS ER 20 MEQ PO TBCR
20.0000 meq | EXTENDED_RELEASE_TABLET | Freq: Two times a day (BID) | ORAL | Status: AC
Start: 2023-08-29 — End: 2023-08-29
  Administered 2023-08-29 (×2): 20 meq via ORAL
  Filled 2023-08-29 (×2): qty 1

## 2023-08-29 MED ORDER — HYDROMORPHONE HCL 1 MG/ML IJ SOLN
0.5000 mg | INTRAMUSCULAR | Status: DC | PRN
  Administered 2023-08-29 (×6): 0.5 mg via INTRAVENOUS
  Filled 2023-08-29 (×6): qty 0.5

## 2023-08-29 MED ORDER — ONDANSETRON HCL 4 MG/2ML IJ SOLN: 4.0000 mg | Freq: Four times a day (QID) | INTRAMUSCULAR | Status: DC | PRN

## 2023-08-29 MED ORDER — HYDROCHLOROTHIAZIDE 12.5 MG PO TABS
12.5000 mg | ORAL_TABLET | Freq: Every day | ORAL | Status: DC
  Administered 2023-08-31 – 2023-09-01 (×2): 12.5 mg via ORAL
  Filled 2023-08-29 (×3): qty 1

## 2023-08-29 NOTE — Progress Notes (Signed)
   08/29/23 1154  TOC Brief Assessment  Insurance and Status Reviewed  Patient has primary care physician Yes  Home environment has been reviewed from home  Prior level of function: independent  Prior/Current Home Services No current home services  Social Drivers of Health Review SDOH reviewed no interventions necessary  Readmission risk has been reviewed Yes  Transition of care needs no transition of care needs at this time     Transition of Care Department [facility]) has reviewed patient and no TOC needs have been identified at this time. We will continue to monitor patient advancement through interdisciplinary progression rounds. If new patient transition needs arise, please place a TOC consult.

## 2023-08-29 NOTE — Hospital Course (Signed)
 68 y.o. male with medical history significant of A-fib with RVR on Eliquis, GERD who presents to the emergency department due to right upper quadrant and epigastric pain that has been worsening.  He was seen in the ED earlier today and was noted to have gallstone in the gallbladder, at that time, patient's abdominal pain subsided and was discharged home.  On returning to the ED, liver enzymes have increased (this was only minimally elevated when first seen in the ED earlier today).  He denies fever, chills, nausea, vomiting.    In the emergency department, vital signs were normal.  Workup in the ED showed normal CBC except for WBC of 12.4, BMP showed chloride of 96, blood glucose 132, AST 574, ALT 526, total bilirubin 5.7.  Lipase 91, urinalysis was unimpressive for UTI.  MRI abdomen without and with contrast (including MRCP) showed findings of acute cholecystitis.  Surgery is waiting for apixaban washout and planning for OR lap chole on 08/30/23.

## 2023-08-29 NOTE — Progress Notes (Signed)
 Subjective: Patient denies any abdominal pain.  Objective: Vital signs in last 24 hours: Temp:  [98.3 F (36.8 C)-99.6 F (37.6 C)] 99.6 F (37.6 C) (02/16 0331) Pulse Rate:  [57-98] 98 (02/16 0331) Resp:  [16-18] 16 (02/16 0331) BP: (126-150)/(80-102) 135/84 (02/16 0331) SpO2:  [92 %-97 %] 92 % (02/16 0331) Last BM Date :  (patient states a few days ago)  Intake/Output from previous day: 02/15 0701 - 02/16 0700 In: 1169 [P.O.:120; IV Piggyback:1049] Out: 350 [Urine:350] Intake/Output this shift: No intake/output data recorded.  General appearance: alert, cooperative, and no distress Eyes: Scleral icterus Resp: clear to auscultation bilaterally Cardio: regularly irregular rhythm GI: soft, non-tender; bowel sounds normal; no masses,  no organomegaly  Lab Results:  Recent Labs    08/28/23 1036 08/29/23 0450  WBC 12.4* 16.2*  HGB 15.3 14.3  HCT 43.6 41.8  PLT 236 225   BMET Recent Labs    08/28/23 1058 08/29/23 0450  NA 135 135  K 3.8 3.4*  CL 96* 98  CO2 27 24  GLUCOSE 132* 99  BUN 16 15  CREATININE 0.96 0.91  CALCIUM 9.2 8.8*   PT/INR No results for input(s): "LABPROT", "INR" in the last 72 hours.  Studies/Results: MR ABDOMEN MRCP W WO CONTAST Result Date: 08/28/2023 CLINICAL DATA:  Jaundice, epigastric abdominal pain, cholelithiasis EXAM: MRI ABDOMEN WITHOUT AND WITH CONTRAST (INCLUDING MRCP) TECHNIQUE: Multiplanar multisequence MR imaging of the abdomen was performed both before and after the administration of intravenous contrast. Heavily T2-weighted images of the biliary and pancreatic ducts were obtained, and three-dimensional MRCP images were rendered by post processing. CONTRAST:  9.46mL GADAVIST GADOBUTROL 1 MMOL/ML IV SOLN COMPARISON:  CT 08/27/2023 FINDINGS: Lower chest: Bibasilar atelectasis.  No acute abnormality. Hepatobiliary: The gallbladder is distended. There is extensive layering sludge within the gallbladder as well as a 17 mm gallstone  within the gallbladder neck. Sludge distends the gallbladder neck and cystic duct. There is trace submucosal edema within the gallbladder neck, best appreciated on image # 23/4, trace pericholecystic inflammatory fluid best seen adjacent to the fundus at axial image # 16/4 and within the right subhepatic space at image # 25-26/4, in keeping with changes of early acute cholecystitis. The gallbladder wall is intact. The distended cystic duct wraps anterior to common hepatic duct and mass effect results in extrinsic compression and near occlusion of the extrahepatic bile duct, best appreciated on image # 17-20, series # for an MRCP image # 6, series # 12. 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 No significant intra or extrahepatic biliary ductal dilation. No intraluminal stone identified within the extrahepatic bile duct. Normal hepatic parenchymal echogenicity. No focal intrahepatic mass identified. The hepatic vasculature is unremarkable. Pancreas: The pancreas is atrophic. No focal intraparenchymal mass is identified. There are no acute peripancreatic inflammatory changes or fluid collections identified. The pancreatic duct is not dilated. There is moderate side branch ectasia noted within the body and tail of the pancreas which may reflect the sequela of remote pancreatitis. No pancreatic divisum. Spleen:  Within normal limits in size and appearance. Adrenals/Urinary Tract: The adrenal glands are unremarkable. The kidneys are normal in size and position. Mild bilateral nonspecific perinephric edema. Tiny simple cyst within the upper pole the left kidney for which no follow-up imaging is recommended. The kidneys are otherwise unremarkable. Stomach/Bowel: Visualized portions within the abdomen are unremarkable. Vascular/Lymphatic: No pathologically enlarged lymph nodes identified. No abdominal aortic aneurysm demonstrated. Other:  None. Musculoskeletal: Multiple  intra  osseous hemangioma are seen within the thoracolumbar spine. No suspicious lytic or blastic bone lesion. IMPRESSION: 1. Distended gallbladder with extensive layering sludge and a 17 mm gallstone within the gallbladder neck. Trace submucosal edema within the gallbladder neck, trace pericholecystic inflammatory fluid, and trace inflammatory fluid within the right subhepatic space, in keeping with changes of early acute cholecystitis. 2. The distended cystic duct wraps anterior to the common hepatic duct and mass effect results in extrinsic compression and near occlusion of the extrahepatic bile duct. 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 No significant intra or extrahepatic biliary ductal dilation. No choledocholithiasis. 3. Atrophic pancreas with moderate side branch ectasia within the body and tail of the pancreas which may reflect the sequela of remote pancreatitis. No acute peripancreatic inflammatory changes or fluid collections identified. Electronically Signed   By: Helyn Numbers M.D.   On: 08/28/2023 20:44   MR 3D Recon At Scanner Result Date: 08/28/2023 CLINICAL DATA:  Jaundice, epigastric abdominal pain, cholelithiasis EXAM: MRI ABDOMEN WITHOUT AND WITH CONTRAST (INCLUDING MRCP) TECHNIQUE: Multiplanar multisequence MR imaging of the abdomen was performed both before and after the administration of intravenous contrast. Heavily T2-weighted images of the biliary and pancreatic ducts were obtained, and three-dimensional MRCP images were rendered by post processing. CONTRAST:  9.5mL GADAVIST GADOBUTROL 1 MMOL/ML IV SOLN COMPARISON:  CT 08/27/2023 FINDINGS: Lower chest: Bibasilar atelectasis.  No acute abnormality. Hepatobiliary: The gallbladder is distended. There is extensive layering sludge within the gallbladder as well as a 17 mm gallstone within the gallbladder neck. Sludge distends the gallbladder neck and cystic duct. There is trace submucosal edema  within the gallbladder neck, best appreciated on image # 23/4, trace pericholecystic inflammatory fluid best seen adjacent to the fundus at axial image # 16/4 and within the right subhepatic space at image # 25-26/4, in keeping with changes of early acute cholecystitis. The gallbladder wall is intact. The distended cystic duct wraps anterior to common hepatic duct and mass effect results in extrinsic compression and near occlusion of the extrahepatic bile duct, best appreciated on image # 17-20, series # for an MRCP image # 6, series # 12. 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 No significant intra or extrahepatic biliary ductal dilation. No intraluminal stone identified within the extrahepatic bile duct. Normal hepatic parenchymal echogenicity. No focal intrahepatic mass identified. The hepatic vasculature is unremarkable. Pancreas: The pancreas is atrophic. No focal intraparenchymal mass is identified. There are no acute peripancreatic inflammatory changes or fluid collections identified. The pancreatic duct is not dilated. There is moderate side branch ectasia noted within the body and tail of the pancreas which may reflect the sequela of remote pancreatitis. No pancreatic divisum. Spleen:  Within normal limits in size and appearance. Adrenals/Urinary Tract: The adrenal glands are unremarkable. The kidneys are normal in size and position. Mild bilateral nonspecific perinephric edema. Tiny simple cyst within the upper pole the left kidney for which no follow-up imaging is recommended. The kidneys are otherwise unremarkable. Stomach/Bowel: Visualized portions within the abdomen are unremarkable. Vascular/Lymphatic: No pathologically enlarged lymph nodes identified. No abdominal aortic aneurysm demonstrated. Other:  None. Musculoskeletal: Multiple intra osseous hemangioma are seen within the thoracolumbar spine. No suspicious lytic or blastic bone lesion. IMPRESSION: 1.  Distended gallbladder with extensive layering sludge and a 17 mm gallstone within the gallbladder neck. Trace submucosal edema within the gallbladder neck, trace pericholecystic inflammatory fluid, and trace inflammatory fluid within the right subhepatic  space, in keeping with changes of early acute cholecystitis. 2. The distended cystic duct wraps anterior to the common hepatic duct and mass effect results in extrinsic compression and near occlusion of the extrahepatic bile duct. 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 No significant intra or extrahepatic biliary ductal dilation. No choledocholithiasis. 3. Atrophic pancreas with moderate side branch ectasia within the body and tail of the pancreas which may reflect the sequela of remote pancreatitis. No acute peripancreatic inflammatory changes or fluid collections identified. Electronically Signed   By: Helyn Numbers M.D.   On: 08/28/2023 20:44   DG Chest Port 1 View Result Date: 08/28/2023 CLINICAL DATA:  Shortness of breath.  Epigastric pain. EXAM: PORTABLE CHEST 1 VIEW COMPARISON:  08/27/2023. FINDINGS: Low lung volume. Bilateral lung fields are clear. Bilateral costophrenic angles are clear. Stable cardio-mediastinal silhouette. No acute osseous abnormalities. The soft tissues are within normal limits. IMPRESSION: No active disease. Electronically Signed   By: Jules Schick M.D.   On: 08/28/2023 10:33    Anti-infectives: Anti-infectives (From admission, onward)    Start     Dose/Rate Route Frequency Ordered Stop   08/28/23 1200  piperacillin-tazobactam (ZOSYN) IVPB 3.375 g        3.375 g 12.5 mL/hr over 240 Minutes Intravenous Every 8 hours 08/28/23 1131         Assessment/Plan: Impression: Acute cholecystitis with cholelithiasis, consistent with Mirizzi's syndrome.  MRCP negative for choledocholithiasis.  The gallstone may be lying on the common bile duct causing the hyperbilirubinemia.  The  patient is scheduled for robotic assisted laparoscopic cholecystectomy tomorrow by Dr. Theodoro Kalata.  The risks and benefits of the procedure including bleeding, infection, cardiopulmonary difficulties, and the possibility of an open procedure were fully explained to the patient, who gave informed consent.  I did explain to him that the surgery is a little bit more complicated due to the Mirizzi's syndrome.  Patient will be greater than 48 hours off of Eliquis.  LOS: 1 day    Franky Macho 08/29/2023

## 2023-08-29 NOTE — Plan of Care (Signed)

## 2023-08-29 NOTE — Plan of Care (Signed)
   Problem: Activity: Goal: Risk for activity intolerance will decrease Outcome: Progressing   Problem: Coping: Goal: Level of anxiety will decrease Outcome: Progressing   Problem: Pain Managment: Goal: General experience of comfort will improve and/or be controlled Outcome: Progressing

## 2023-08-29 NOTE — Progress Notes (Signed)
 PROGRESS NOTE  Calvin Fernandez  ZOX:[id number] DOB: 1955-10-17 DOA: 08/28/2023 PCP: Benita Stabile, MD   Chief Complaint  Patient presents with   Abdominal Pain   Level of care: Med-Surg  Brief Admission History:  68 y.o. male with medical history significant of A-fib with RVR on Eliquis, GERD who presents to the emergency department due to right upper quadrant and epigastric pain that has been worsening.  He was seen in the ED earlier today and was noted to have gallstone in the gallbladder, at that time, patient's abdominal pain subsided and was discharged home.  On returning to the ED, liver enzymes have increased (this was only minimally elevated when first seen in the ED earlier today).  He denies fever, chills, nausea, vomiting.    In the emergency department, vital signs were normal.  Workup in the ED showed normal CBC except for WBC of 12.4, BMP showed chloride of 96, blood glucose 132, AST 574, ALT 526, total bilirubin 5.7.  Lipase 91, urinalysis was unimpressive for UTI.  MRI abdomen without and with contrast (including MRCP) showed findings of acute cholecystitis.  Surgery is waiting for apixaban washout and planning for OR lap chole on 08/30/23.     Assessment and Plan:  Acute cholecystitis Continue IV Dilaudid 0.5 mg every 3 hours as needed Continue Zofran as needed Continue IV hydration Continue Zosyn Blood culture obtained, no growth to date  Right upper quadrant ultrasound:  There is a 2.5 cm gallstone without imaging signs of acute cholecystitis. Apixaban being held  General Surgery consult was appreciated.   Transaminitis and hyperbilirubinemia AST 574, ALT 526, total bilirubin 5.7 Secondary to Mirizzi's syndrome Continue to trend liver enzymes   Elevated lipase Lipase 91.  MRCP did not show pancreatitis Continue to monitor lipase levels   Chronic A-fib with RVR Eliquis will be held at this time due to impending surgical intervention Continue Cardizem per home  regimen   GERD Continue PPI   DVT prophylaxis: SCDs  Code Status: Full  Family Communication:  Disposition: anticipate home   Consultants:  GI   Procedures:  Tentative lap chole planned for 2/15  Antimicrobials:    Subjective: Pt reporting he is still having a lot of abdominal pain. He is eager to have his gallbladder removed.   Objective: Vitals:   08/28/23 2230 08/28/23 2310 08/29/23 0331 08/29/23 1200  BP: 132/86 (!) 132/92 135/84 (!) 143/92  Pulse: 73 90 98 95  Resp: 18 16 16    Temp:  98.4 F (36.9 C) 99.6 F (37.6 C)   TempSrc:  Oral Oral   SpO2: 95% 93% 92%   Weight:      Height:        Intake/Output Summary (Last 24 hours) at 08/29/2023 1329 Last data filed at 08/29/2023 0300 Gross per 24 hour  Intake 1169 ml  Output 350 ml  Net 819 ml   Filed Weights   08/28/23 0811  Weight: 95.3 kg   Examination:  General exam: Appears calm and comfortable  Respiratory system: Clear to auscultation. Respiratory effort normal. Cardiovascular system: normal S1 & S2 heard. No JVD, murmurs, rubs, gallops or clicks. No pedal edema. Gastrointestinal system: Abdomen is nondistended, soft but with some guarding, RUQ tenderness with light palpation,  No organomegaly or masses felt. Normal bowel sounds heard. Central nervous system: Alert and oriented. No focal neurological deficits. Extremities: Symmetric 5 x 5 power. Skin: No rashes, lesions or ulcers. Psychiatry: Judgement and insight appear normal. Mood & affect  appropriate.   Data Reviewed: I have personally reviewed following labs and imaging studies  CBC: Recent Labs  Lab 08/27/23 0231 08/28/23 0343 08/28/23 0929 08/28/23 1036 08/29/23 0450  WBC 15.4* 13.2* 14.8* 12.4* 16.2*  NEUTROABS 11.5* 10.4* 12.0* 10.7*  --   HGB 16.6 15.7 16.5 15.3 14.3  HCT 47.2 43.9 47.5 43.6 41.8  MCV 89.9 89.2 90.8 90.6 93.3  PLT 307 247 267 236 225    Basic Metabolic Panel: Recent Labs  Lab 08/27/23 0231 08/28/23 0343  08/28/23 1058 08/29/23 0450  NA 139 134* 135 135  K 3.3* 3.1* 3.8 3.4*  CL 98 99 96* 98  CO2 27 24 27 24   GLUCOSE 123* 144* 132* 99  BUN 19 14 16 15   CREATININE 1.08 0.92 0.96 0.91  CALCIUM 9.9 9.2 9.2 8.8*  MG  --   --   --  2.0  PHOS  --   --   --  2.7    CBG: No results for input(s): "GLUCAP" in the last 168 hours.  Recent Results (from the past 240 hours)  Culture, blood (Routine X 2) w Reflex to ID Panel     Status: None (Preliminary result)   Collection Time: 08/29/23  4:49 AM   Specimen: BLOOD LEFT HAND  Result Value Ref Range Status   Specimen Description BLOOD LEFT HAND BLOOD  Final   Special Requests   Final    BOTTLES DRAWN AEROBIC AND ANAEROBIC Blood Culture adequate volume Performed at [facility], [address], [place], [place] [address]    Culture PENDING  Incomplete   Report Status PENDING  Incomplete  Culture, blood (Routine X 2) w Reflex to ID Panel     Status: None (Preliminary result)   Collection Time: 08/29/23  5:00 AM   Specimen: BLOOD LEFT HAND  Result Value Ref Range Status   Specimen Description BLOOD LEFT HAND BLOOD  Final   Special Requests   Final    BOTTLES DRAWN AEROBIC AND ANAEROBIC Blood Culture adequate volume Performed at [facility], [address] [place], [place] [address]    Culture PENDING  Incomplete   Report Status PENDING  Incomplete   Radiology Studies: MR ABDOMEN MRCP W WO CONTAST Result Date: 08/28/2023 CLINICAL DATA:  Jaundice, epigastric abdominal pain, cholelithiasis EXAM: MRI ABDOMEN WITHOUT AND WITH CONTRAST (INCLUDING MRCP) TECHNIQUE: Multiplanar multisequence MR imaging of the abdomen was performed both before and after the administration of intravenous contrast. Heavily T2-weighted images of the biliary and pancreatic ducts were obtained, and three-dimensional MRCP images were rendered by post processing. CONTRAST:  9.27mL GADAVIST GADOBUTROL 1 MMOL/ML IV SOLN COMPARISON:  CT 08/27/2023 FINDINGS: Lower chest:  Bibasilar atelectasis.  No acute abnormality. Hepatobiliary: The gallbladder is distended. There is extensive layering sludge within the gallbladder as well as a 17 mm gallstone within the gallbladder neck. Sludge distends the gallbladder neck and cystic duct. There is trace submucosal edema within the gallbladder neck, best appreciated on image # 23/4, trace pericholecystic inflammatory fluid best seen adjacent to the fundus at axial image # 16/4 and within the right subhepatic space at image # 25-26/4, in keeping with changes of early acute cholecystitis. The gallbladder wall is intact. The distended cystic duct wraps anterior to common hepatic duct and mass effect results in extrinsic compression and near occlusion of the extrahepatic bile duct, best appreciated on image # 17-20, series # for an MRCP image # 6, series # 12. This would be compatible with Mirizzi's syndrome in  the appropriate clinical setting. No significant intra or extrahepatic biliary ductal dilation. No intraluminal stone identified within the extrahepatic bile duct. Normal hepatic parenchymal echogenicity. No focal intrahepatic mass identified. The hepatic vasculature is unremarkable. Pancreas: The pancreas is atrophic. No focal intraparenchymal mass is identified. There are no acute peripancreatic inflammatory changes or fluid collections identified. The pancreatic duct is not dilated. There is moderate side branch ectasia noted within the body and tail of the pancreas which may reflect the sequela of remote pancreatitis. No pancreatic divisum. Spleen:  Within normal limits in size and appearance. Adrenals/Urinary Tract: The adrenal glands are unremarkable. The kidneys are normal in size and position. Mild bilateral nonspecific perinephric edema. Tiny simple cyst within the upper pole the left kidney for which no follow-up imaging is recommended. The kidneys are otherwise unremarkable. Stomach/Bowel: Visualized portions within the abdomen are  unremarkable. Vascular/Lymphatic: No pathologically enlarged lymph nodes identified. No abdominal aortic aneurysm demonstrated. Other:  None. Musculoskeletal: Multiple intra osseous hemangioma are seen within the thoracolumbar spine. No suspicious lytic or blastic bone lesion. IMPRESSION: 1. Distended gallbladder with extensive layering sludge and a 17 mm gallstone within the gallbladder neck. Trace submucosal edema within the gallbladder neck, trace pericholecystic inflammatory fluid, and trace inflammatory fluid within the right subhepatic space, in keeping with changes of early acute cholecystitis. 2. The distended cystic duct wraps anterior to the common hepatic duct and mass effect results in extrinsic compression and near occlusion of the extrahepatic bile duct. 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 No significant intra or extrahepatic biliary ductal dilation. No choledocholithiasis. 3. Atrophic pancreas with moderate side branch ectasia within the body and tail of the pancreas which may reflect the sequela of remote pancreatitis. No acute peripancreatic inflammatory changes or fluid collections identified. Electronically Signed   By: Helyn Numbers M.D.   On: 08/28/2023 20:44   MR 3D Recon At Scanner Result Date: 08/28/2023 CLINICAL DATA:  Jaundice, epigastric abdominal pain, cholelithiasis EXAM: MRI ABDOMEN WITHOUT AND WITH CONTRAST (INCLUDING MRCP) TECHNIQUE: Multiplanar multisequence MR imaging of the abdomen was performed both before and after the administration of intravenous contrast. Heavily T2-weighted images of the biliary and pancreatic ducts were obtained, and three-dimensional MRCP images were rendered by post processing. CONTRAST:  9.51mL GADAVIST GADOBUTROL 1 MMOL/ML IV SOLN COMPARISON:  CT 08/27/2023 FINDINGS: Lower chest: Bibasilar atelectasis.  No acute abnormality. Hepatobiliary: The gallbladder is distended. There is extensive layering  sludge within the gallbladder as well as a 17 mm gallstone within the gallbladder neck. Sludge distends the gallbladder neck and cystic duct. There is trace submucosal edema within the gallbladder neck, best appreciated on image # 23/4, trace pericholecystic inflammatory fluid best seen adjacent to the fundus at axial image # 16/4 and within the right subhepatic space at image # 25-26/4, in keeping with changes of early acute cholecystitis. The gallbladder wall is intact. The distended cystic duct wraps anterior to common hepatic duct and mass effect results in extrinsic compression and near occlusion of the extrahepatic bile duct, best appreciated on image # 17-20, series # for an MRCP image # 6, series # 12. 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 No significant intra or extrahepatic biliary ductal dilation. No intraluminal stone identified within the extrahepatic bile duct. Normal hepatic parenchymal echogenicity. No focal intrahepatic mass identified. The hepatic vasculature is unremarkable. Pancreas: The pancreas is atrophic. No focal intraparenchymal mass is identified. There are no acute peripancreatic inflammatory changes  or fluid collections identified. The pancreatic duct is not dilated. There is moderate side branch ectasia noted within the body and tail of the pancreas which may reflect the sequela of remote pancreatitis. No pancreatic divisum. Spleen:  Within normal limits in size and appearance. Adrenals/Urinary Tract: The adrenal glands are unremarkable. The kidneys are normal in size and position. Mild bilateral nonspecific perinephric edema. Tiny simple cyst within the upper pole the left kidney for which no follow-up imaging is recommended. The kidneys are otherwise unremarkable. Stomach/Bowel: Visualized portions within the abdomen are unremarkable. Vascular/Lymphatic: No pathologically enlarged lymph nodes identified. No abdominal aortic aneurysm  demonstrated. Other:  None. Musculoskeletal: Multiple intra osseous hemangioma are seen within the thoracolumbar spine. No suspicious lytic or blastic bone lesion. IMPRESSION: 1. Distended gallbladder with extensive layering sludge and a 17 mm gallstone within the gallbladder neck. Trace submucosal edema within the gallbladder neck, trace pericholecystic inflammatory fluid, and trace inflammatory fluid within the right subhepatic space, in keeping with changes of early acute cholecystitis. 2. The distended cystic duct wraps anterior to the common hepatic duct and mass effect results in extrinsic compression and near occlusion of the extrahepatic bile duct. 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 No significant intra or extrahepatic biliary ductal dilation. No choledocholithiasis. 3. Atrophic pancreas with moderate side branch ectasia within the body and tail of the pancreas which may reflect the sequela of remote pancreatitis. No acute peripancreatic inflammatory changes or fluid collections identified. Electronically Signed   By: Helyn Numbers M.D.   On: 08/28/2023 20:44   DG Chest Port 1 View Result Date: 08/28/2023 CLINICAL DATA:  Shortness of breath.  Epigastric pain. EXAM: PORTABLE CHEST 1 VIEW COMPARISON:  08/27/2023. FINDINGS: Low lung volume. Bilateral lung fields are clear. Bilateral costophrenic angles are clear. Stable cardio-mediastinal silhouette. No acute osseous abnormalities. The soft tissues are within normal limits. IMPRESSION: No active disease. Electronically Signed   By: Jules Schick M.D.   On: 08/28/2023 10:33   Scheduled Meds:  [START ON 08/30/2023] hydrochlorothiazide  12.5 mg Oral Daily   potassium chloride  20 mEq Oral BID   Continuous Infusions:  lactated ringers 50 mL/hr at 08/29/23 0916   piperacillin-tazobactam (ZOSYN)  IV 3.375 g (08/29/23 0604)    LOS: 1 day   Time spent: 55 mins  Fae Blossom Laural Benes, MD How to contact the  [facility] Attending or Consulting provider 7A - 7P or covering provider during after hours 7P -7A, for this patient?  Check the care team in [facility] and look for a) attending/consulting TRH provider listed and b) the [facility] team listed Log into [email or web address] to find provider on call.  Locate the [facility] provider you are looking for under [facility] and page to a number that you can be directly reached. If you still have difficulty reaching the provider, please page the [facility] (Director on Call) for the Hospitalists listed on amion for assistance.  08/29/2023, 1:29 PM

## 2023-08-29 NOTE — Progress Notes (Signed)
 Patient c/o headache, expressed concerns about his blood pressure medication which has been held temporarily for his potassium level. Patient educated and asked writer to check Blood pressure and I did and gave patient PRN tylenol for c/o headache, Positive redirection with patient as he seems a bit anxious and worried.   08/29/23 1200  Vitals  BP (!) 143/92  Pulse Rate 95  MEWS COLOR  MEWS Score Color Green  Pain Assessment  Pain Scale 0-10  Pain Score 6  Pain Type Acute pain  Pain Location Head  MEWS Score  MEWS Temp 0  MEWS Systolic 0  MEWS Pulse 0  MEWS RR 0  MEWS LOC 0  MEWS Score 0

## 2023-08-30 ENCOUNTER — Other Ambulatory Visit: Payer: Self-pay

## 2023-08-30 ENCOUNTER — Encounter (HOSPITAL_COMMUNITY)
Admission: EM | Disposition: A | Discharge status: Home / Self Care | Payer: Self-pay | Source: Home / Self Care | Attending: Family Medicine

## 2023-08-30 ENCOUNTER — Inpatient Hospital Stay (HOSPITAL_COMMUNITY): Payer: Medicare PPO | Admitting: Certified Registered"

## 2023-08-30 ENCOUNTER — Encounter (HOSPITAL_COMMUNITY): Payer: Self-pay | Admitting: Internal Medicine

## 2023-08-30 DIAGNOSIS — I4891 Unspecified atrial fibrillation: Secondary | ICD-10-CM | POA: Diagnosis not present

## 2023-08-30 DIAGNOSIS — I482 Chronic atrial fibrillation, unspecified: Secondary | ICD-10-CM | POA: Diagnosis not present

## 2023-08-30 DIAGNOSIS — K810 Acute cholecystitis: Secondary | ICD-10-CM | POA: Diagnosis not present

## 2023-08-30 DIAGNOSIS — I10 Essential (primary) hypertension: Secondary | ICD-10-CM | POA: Diagnosis not present

## 2023-08-30 DIAGNOSIS — K2100 Gastro-esophageal reflux disease with esophagitis, without bleeding: Secondary | ICD-10-CM

## 2023-08-30 DIAGNOSIS — R7401 Elevation of levels of liver transaminase levels: Secondary | ICD-10-CM | POA: Diagnosis not present

## 2023-08-30 DIAGNOSIS — K8000 Calculus of gallbladder with acute cholecystitis without obstruction: Secondary | ICD-10-CM

## 2023-08-30 LAB — SURGICAL PCR SCREEN
MRSA, PCR: NEGATIVE
Staphylococcus aureus: NEGATIVE

## 2023-08-30 LAB — COMPREHENSIVE METABOLIC PANEL
ALT: 356 U/L — ABNORMAL HIGH (ref 0–44)
AST: 136 U/L — ABNORMAL HIGH (ref 15–41)
Albumin: 3 g/dL — ABNORMAL LOW (ref 3.5–5.0)
Alkaline Phosphatase: 112 U/L (ref 38–126)
Anion gap: 8 (ref 5–15)
BUN: 21 mg/dL (ref 8–23)
CO2: 28 mmol/L (ref 22–32)
Calcium: 8.5 mg/dL — ABNORMAL LOW (ref 8.9–10.3)
Chloride: 97 mmol/L — ABNORMAL LOW (ref 98–111)
Creatinine, Ser: 1.09 mg/dL (ref 0.61–1.24)
GFR, Estimated: >60 mL/min (ref >60)
Glucose, Bld: 99 mg/dL (ref 70–99)
Potassium: 3.6 mmol/L (ref 3.5–5.1)
Sodium: 133 mmol/L — ABNORMAL LOW (ref 135–145)
Total Bilirubin: 6 mg/dL — ABNORMAL HIGH (ref 0.0–1.2)
Total Protein: 6.3 g/dL — ABNORMAL LOW (ref 6.5–8.1)

## 2023-08-30 LAB — CBC WITH DIFFERENTIAL/PLATELET
Abs Immature Granulocytes: 0.06 10*3/uL (ref 0.00–0.07)
Basophils Absolute: 0 10*3/uL (ref 0.0–0.1)
Basophils Relative: 0 %
Eosinophils Absolute: 0.1 10*3/uL (ref 0.0–0.5)
Eosinophils Relative: 1 %
HCT: 40.2 % (ref 39.0–52.0)
Hemoglobin: 13.1 g/dL (ref 13.0–17.0)
Immature Granulocytes: 0 %
Lymphocytes Relative: 7 %
Lymphs Abs: 0.9 10*3/uL (ref 0.7–4.0)
MCH: 31 pg (ref 26.0–34.0)
MCHC: 32.6 g/dL (ref 30.0–36.0)
MCV: 95 fL (ref 80.0–100.0)
Monocytes Absolute: 1.4 10*3/uL — ABNORMAL HIGH (ref 0.1–1.0)
Monocytes Relative: 10 %
Neutro Abs: 11.6 10*3/uL — ABNORMAL HIGH (ref 1.7–7.7)
Neutrophils Relative %: 82 %
Platelets: 198 10*3/uL (ref 150–400)
RBC: 4.23 MIL/uL (ref 4.22–5.81)
RDW: 12.8 % (ref 11.5–15.5)
WBC: 14.1 10*3/uL — ABNORMAL HIGH (ref 4.0–10.5)
nRBC: 0 % (ref 0.0–0.2)

## 2023-08-30 SURGERY — CHOLECYSTECTOMY, ROBOT-ASSISTED, LAPAROSCOPIC
Anesthesia: General | Site: Abdomen

## 2023-08-30 MED ORDER — INDOCYANINE GREEN 25 MG IV SOLR
2.5000 mg | Freq: Once | INTRAVENOUS | Status: AC
  Administered 2023-08-30: 2.5 mg via INTRAVENOUS

## 2023-08-30 MED ORDER — DEXAMETHASONE SODIUM PHOSPHATE 10 MG/ML IJ SOLN
INTRAMUSCULAR | Status: DC | PRN
  Administered 2023-08-30: 5 mg via INTRAVENOUS

## 2023-08-30 MED ORDER — FENTANYL CITRATE (PF) 250 MCG/5ML IJ SOLN
INTRAMUSCULAR | Status: AC
  Filled 2023-08-30: qty 5

## 2023-08-30 MED ORDER — DEXMEDETOMIDINE HCL IN NACL 80 MCG/20ML IV SOLN
INTRAVENOUS | Status: DC | PRN
Start: 2023-08-30 — End: 2023-08-30
  Administered 2023-08-30 (×3): 8 ug via INTRAVENOUS

## 2023-08-30 MED ORDER — ROCURONIUM BROMIDE 10 MG/ML (PF) SYRINGE
PREFILLED_SYRINGE | INTRAVENOUS | Status: DC | PRN
  Administered 2023-08-30 (×2): 10 mg via INTRAVENOUS
  Administered 2023-08-30: 20 mg via INTRAVENOUS
  Administered 2023-08-30: 50 mg via INTRAVENOUS

## 2023-08-30 MED ORDER — OXYCODONE HCL 5 MG/5ML PO SOLN: 5.0000 mg | Freq: Once | ORAL | Status: DC | PRN

## 2023-08-30 MED ORDER — OXYCODONE HCL 5 MG PO TABS: 5.0000 mg | ORAL_TABLET | Freq: Once | ORAL | Status: DC | PRN

## 2023-08-30 MED ORDER — LIDOCAINE HCL (PF) 2 % IJ SOLN
INTRAMUSCULAR | Status: AC
  Filled 2023-08-30: qty 5

## 2023-08-30 MED ORDER — DEXAMETHASONE SODIUM PHOSPHATE 10 MG/ML IJ SOLN
INTRAMUSCULAR | Status: AC
  Filled 2023-08-30: qty 1

## 2023-08-30 MED ORDER — ROCURONIUM BROMIDE 10 MG/ML (PF) SYRINGE
PREFILLED_SYRINGE | INTRAVENOUS | Status: AC
  Filled 2023-08-30: qty 10

## 2023-08-30 MED ORDER — PROPOFOL 10 MG/ML IV BOLUS
INTRAVENOUS | Status: DC | PRN
Start: 2023-08-30 — End: 2023-08-30
  Administered 2023-08-30: 150 mg via INTRAVENOUS
  Administered 2023-08-30: 50 mg via INTRAVENOUS

## 2023-08-30 MED ORDER — MIDAZOLAM HCL 2 MG/2ML IJ SOLN
INTRAMUSCULAR | Status: AC
  Filled 2023-08-30: qty 2

## 2023-08-30 MED ORDER — PROPOFOL 10 MG/ML IV BOLUS
INTRAVENOUS | Status: AC
  Filled 2023-08-30: qty 20

## 2023-08-30 MED ORDER — CHLORHEXIDINE GLUCONATE CLOTH 2 % EX PADS
6.0000 | MEDICATED_PAD | Freq: Once | CUTANEOUS | Status: AC
  Administered 2023-08-30: 6 via TOPICAL

## 2023-08-30 MED ORDER — ACETAMINOPHEN 500 MG PO TABS
1000.0000 mg | ORAL_TABLET | Freq: Four times a day (QID) | ORAL | Status: DC
  Administered 2023-08-30 – 2023-09-01 (×7): 1000 mg via ORAL
  Filled 2023-08-30 (×7): qty 2

## 2023-08-30 MED ORDER — SENNOSIDES-DOCUSATE SODIUM 8.6-50 MG PO TABS
1.0000 | ORAL_TABLET | Freq: Two times a day (BID) | ORAL | Status: DC
  Administered 2023-08-30 – 2023-09-01 (×4): 1 via ORAL
  Filled 2023-08-30 (×5): qty 1

## 2023-08-30 MED ORDER — ONDANSETRON HCL 4 MG/2ML IJ SOLN: 4.0000 mg | Freq: Once | INTRAMUSCULAR | Status: DC | PRN

## 2023-08-30 MED ORDER — LACTATED RINGERS IV SOLN: INTRAVENOUS | Status: DC

## 2023-08-30 MED ORDER — ONDANSETRON HCL 4 MG/2ML IJ SOLN
INTRAMUSCULAR | Status: AC
  Filled 2023-08-30: qty 2

## 2023-08-30 MED ORDER — SODIUM CHLORIDE 0.9 % IR SOLN
Status: DC | PRN
  Administered 2023-08-30: 3000 mL

## 2023-08-30 MED ORDER — BUPIVACAINE HCL (PF) 0.5 % IJ SOLN
INTRAMUSCULAR | Status: AC
  Filled 2023-08-30: qty 30

## 2023-08-30 MED ORDER — ONDANSETRON HCL 4 MG/2ML IJ SOLN
INTRAMUSCULAR | Status: DC | PRN
  Administered 2023-08-30: 4 mg via INTRAVENOUS

## 2023-08-30 MED ORDER — MIDAZOLAM HCL 2 MG/2ML IJ SOLN
INTRAMUSCULAR | Status: DC | PRN
  Administered 2023-08-30: 2 mg via INTRAVENOUS

## 2023-08-30 MED ORDER — CHLORHEXIDINE GLUCONATE 0.12 % MT SOLN
15.0000 mL | Freq: Once | OROMUCOSAL | Status: AC
  Administered 2023-08-30: 15 mL via OROMUCOSAL

## 2023-08-30 MED ORDER — BUPIVACAINE HCL (PF) 0.5 % IJ SOLN
INTRAMUSCULAR | Status: DC | PRN
  Administered 2023-08-30: 30 mL

## 2023-08-30 MED ORDER — SODIUM CHLORIDE 0.9 % IV SOLN: INTRAVENOUS | Status: DC | PRN

## 2023-08-30 MED ORDER — OXYCODONE HCL 5 MG PO TABS
5.0000 mg | ORAL_TABLET | ORAL | Status: DC | PRN
  Administered 2023-08-30: 5 mg via ORAL
  Filled 2023-08-30: qty 1

## 2023-08-30 MED ORDER — DEXMEDETOMIDINE HCL IN NACL 80 MCG/20ML IV SOLN
INTRAVENOUS | Status: AC
  Filled 2023-08-30: qty 20

## 2023-08-30 MED ORDER — POLYETHYLENE GLYCOL 3350 17 G PO PACK
17.0000 g | PACK | Freq: Every day | ORAL | Status: DC
  Administered 2023-08-30 – 2023-09-01 (×3): 17 g via ORAL
  Filled 2023-08-30 (×3): qty 1

## 2023-08-30 MED ORDER — ORAL CARE MOUTH RINSE: 15.0000 mL | Freq: Once | OROMUCOSAL | Status: AC

## 2023-08-30 MED ORDER — STERILE WATER FOR IRRIGATION IR SOLN
Status: DC | PRN
  Administered 2023-08-30: 500 mL

## 2023-08-30 MED ORDER — FENTANYL CITRATE (PF) 250 MCG/5ML IJ SOLN
INTRAMUSCULAR | Status: DC | PRN
  Administered 2023-08-30 (×5): 50 ug via INTRAVENOUS

## 2023-08-30 MED ORDER — FENTANYL CITRATE PF 50 MCG/ML IJ SOSY
25.0000 ug | PREFILLED_SYRINGE | INTRAMUSCULAR | Status: DC | PRN
  Administered 2023-08-30: 50 ug via INTRAVENOUS
  Filled 2023-08-30: qty 1

## 2023-08-30 MED ORDER — ACETAMINOPHEN 10 MG/ML IV SOLN
INTRAVENOUS | Status: AC
  Filled 2023-08-30: qty 100

## 2023-08-30 MED ORDER — LIDOCAINE HCL (PF) 2 % IJ SOLN
INTRAMUSCULAR | Status: DC | PRN
  Administered 2023-08-30: 100 mg via INTRADERMAL

## 2023-08-30 MED ORDER — SUGAMMADEX SODIUM 200 MG/2ML IV SOLN
INTRAVENOUS | Status: DC | PRN
  Administered 2023-08-30: 200 mg via INTRAVENOUS

## 2023-08-30 MED ORDER — ACETAMINOPHEN 10 MG/ML IV SOLN
INTRAVENOUS | Status: DC | PRN
Start: 2023-08-30 — End: 2023-08-30
  Administered 2023-08-30: 1000 mg via INTRAVENOUS

## 2023-08-30 MED ORDER — HEMOSTATIC AGENTS (NO CHARGE) OPTIME
TOPICAL | Status: DC | PRN
  Administered 2023-08-30 (×2): 1 via TOPICAL

## 2023-08-30 SURGICAL SUPPLY — 41 items
BLADE SURG 15 STRL LF DISP TIS (BLADE) ×1 IMPLANT
CAUTERY HOOK MNPLR 1.6 DVNC XI (INSTRUMENTS) ×1 IMPLANT
CHLORAPREP W/TINT 26 (MISCELLANEOUS) ×1 IMPLANT
CLIP LIGATING HEM O LOK PURPLE (MISCELLANEOUS) ×1 IMPLANT
DEFOGGER SCOPE WARMER CLEARIFY (MISCELLANEOUS) ×1 IMPLANT
DERMABOND ADVANCED .7 DNX12 (GAUZE/BANDAGES/DRESSINGS) ×1 IMPLANT
DRAPE ARM DVNC X/XI (DISPOSABLE) ×4 IMPLANT
DRAPE COLUMN DVNC XI (DISPOSABLE) ×1 IMPLANT
ELECT REM PT RETURN 9FT ADLT (ELECTROSURGICAL) ×1 IMPLANT
ELECTRODE REM PT RTRN 9FT ADLT (ELECTROSURGICAL) ×1 IMPLANT
FORCEPS BPLR R/ABLATION 8 DVNC (INSTRUMENTS) ×1 IMPLANT
FORCEPS PROGRASP DVNC XI (FORCEP) ×1 IMPLANT
GLOVE BIOGEL PI IND STRL 6.5 (GLOVE) ×2 IMPLANT
GLOVE BIOGEL PI IND STRL 7.0 (GLOVE) ×3 IMPLANT
GLOVE SURG SS PI 6.5 STRL IVOR (GLOVE) ×2 IMPLANT
GOWN STRL REUS W/TWL LRG LVL3 (GOWN DISPOSABLE) ×3 IMPLANT
GRASPER SUT TROCAR 14GX15 (MISCELLANEOUS) ×1 IMPLANT
HEMOSTAT SNOW SURGICEL 2X4 (HEMOSTASIS) IMPLANT
IRRIGATOR SUCT 8 DISP DVNC XI (IRRIGATION / IRRIGATOR) IMPLANT
KIT TURNOVER KIT A (KITS) ×1 IMPLANT
MANIFOLD NEPTUNE II (INSTRUMENTS) ×1 IMPLANT
NDL HYPO 21X1.5 SAFETY (NEEDLE) ×1 IMPLANT
NDL INSUFFLATION 14GA 120MM (NEEDLE) ×1 IMPLANT
NEEDLE HYPO 21X1.5 SAFETY (NEEDLE) ×1 IMPLANT
NEEDLE INSUFFLATION 14GA 120MM (NEEDLE) ×1 IMPLANT
OBTURATOR OPTICAL STND 8 DVNC (TROCAR) ×1 IMPLANT
OBTURATOR OPTICALSTD 8 DVNC (TROCAR) ×1 IMPLANT
PACK LAP CHOLE LZT030E (CUSTOM PROCEDURE TRAY) ×1 IMPLANT
PAD ARMBOARD 7.5X6 YLW CONV (MISCELLANEOUS) ×1 IMPLANT
POSITIONER HEAD 8X9X4 ADT (SOFTGOODS) ×1 IMPLANT
POWDER SURGICEL 3.0 GRAM (HEMOSTASIS) IMPLANT
SEAL UNIV 5-12 XI (MISCELLANEOUS) ×3 IMPLANT
SET BASIN LINEN APH (SET/KITS/TRAYS/PACK) ×1 IMPLANT
SET TUBE SMOKE EVAC HIGH FLOW (TUBING) ×1 IMPLANT
SUT MNCRL AB 4-0 PS2 18 (SUTURE) ×2 IMPLANT
SUT VICRYL 0 AB UR-6 (SUTURE) IMPLANT
SYR 30ML LL (SYRINGE) ×1 IMPLANT
SYS RETRIEVAL 5MM INZII UNIV (BASKET) ×1 IMPLANT
SYSTEM RETRIEVL 5MM INZII UNIV (BASKET) IMPLANT
TIP ENDOSCOPIC SURGICEL (TIP) IMPLANT
WATER STERILE IRR 500ML POUR (IV SOLUTION) ×1 IMPLANT

## 2023-08-30 NOTE — Progress Notes (Signed)
 PROGRESS NOTE  Calvin Fernandez  AVW:[id number] DOB: [birth year] DOA: 08/28/2023 PCP: Benita Stabile, MD   Chief Complaint  Patient presents with   Abdominal Pain   Level of care: Med-Surg  Brief Admission History:  68 y.o. male with medical history significant of A-fib with RVR on Eliquis, GERD who presents to the emergency department due to right upper quadrant and epigastric pain that has been worsening.  He was seen in the ED earlier today and was noted to have gallstone in the gallbladder, at that time, patient's abdominal pain subsided and was discharged home.  On returning to the ED, liver enzymes have increased (this was only minimally elevated when first seen in the ED earlier today).  He denies fever, chills, nausea, vomiting.    In the emergency department, vital signs were normal.  Workup in the ED showed normal CBC except for WBC of 12.4, BMP showed chloride of 96, blood glucose 132, AST 574, ALT 526, total bilirubin 5.7.  Lipase 91, urinalysis was unimpressive for UTI.  MRI abdomen without and with contrast (including MRCP) showed findings of acute cholecystitis.  Surgery is waiting for apixaban washout and planning for OR lap chole on 08/30/23.     Assessment and Plan:  Acute cholecystitis Postop day 0 status post lap chole 08/30/2023 Continue postop orders per surgeon Continue IV Dilaudid 0.5 mg every 3 hours as needed Continue Zofran as needed Continue IV hydration Continue Zosyn Blood culture obtained, no growth to date  Right upper quadrant ultrasound:  There is a 2.5 cm gallstone without imaging signs of acute cholecystitis. Apixaban being held for 2 days per surgeon  General Surgery consult was appreciated.   Transaminitis and hyperbilirubinemia AST 574, ALT 526, total bilirubin 5.7 Secondary to Mirizzi's syndrome S/p lap chole 08/30/23    Elevated lipase Lipase 91.  MRCP did not show pancreatitis   Chronic A-fib with RVR Eliquis will be held at this time due to  recent surgery Continue Cardizem per home regimen   GERD Continue PPI   DVT prophylaxis: SCDs  Code Status: Full  Family Communication: wife at bedside  Disposition: anticipate home in 1-2 days when ok with surgery    Consultants:   Surgery   Procedures:  Tentative lap chole planned for 2/15  Antimicrobials:   Zosyn IV   Subjective: Patient seen in the immediate postop period, pain controlled, no other complaints  Objective: Vitals:   08/30/23 1127 08/30/23 1130 08/30/23 1145 08/30/23 1200  BP:  124/66 131/76 122/72  Pulse: 67 70 74 74  Resp: 18 17 (!) 21   Temp:   97.9 F (36.6 C) 98.2 F (36.8 C)  TempSrc:      SpO2: 93% 93% 92% 91%  Weight:      Height:        Intake/Output Summary (Last 24 hours) at 08/30/2023 1557 Last data filed at 08/30/2023 1101 Gross per 24 hour  Intake 900 ml  Output 200 ml  Net 700 ml   Filed Weights   08/28/23 0811 08/30/23 0712  Weight: 95.3 kg 95.3 kg   Examination:  General exam: Appears calm and comfortable  Respiratory system: Clear to auscultation. Respiratory effort normal. Cardiovascular system: normal S1 & S2 heard. No JVD, murmurs, rubs, gallops or clicks. No pedal edema. Gastrointestinal system: Abdomen is nondistended, soft but with some guarding, RUQ tenderness with light palpation,  No organomegaly or masses felt. Normal bowel sounds heard. Central nervous system: Alert and oriented. No focal neurological  deficits. Extremities: Symmetric 5 x 5 power. Skin: No rashes, lesions or ulcers. Psychiatry: Judgement and insight appear normal. Mood & affect appropriate.   Data Reviewed: I have personally reviewed following labs and imaging studies  CBC: Recent Labs  Lab 08/27/23 0231 08/28/23 0343 08/28/23 0929 08/28/23 1036 08/29/23 0450 08/30/23 0420  WBC 15.4* 13.2* 14.8* 12.4* 16.2* 14.1*  NEUTROABS 11.5* 10.4* 12.0* 10.7*  --  11.6*  HGB 16.6 15.7 16.5 15.3 14.3 13.1  HCT 47.2 43.9 47.5 43.6 41.8 40.2  MCV  89.9 89.2 90.8 90.6 93.3 95.0  PLT 307 247 267 236 225 198    Basic Metabolic Panel: Recent Labs  Lab 08/27/23 0231 08/28/23 0343 08/28/23 1058 08/29/23 0450 08/30/23 0420  NA 139 134* 135 135 133*  K 3.3* 3.1* 3.8 3.4* 3.6  CL 98 99 96* 98 97*  CO2 27 24 27 24 28   GLUCOSE 123* 144* 132* 99 99  BUN 19 14 16 15 21   CREATININE 1.08 0.92 0.96 0.91 1.09  CALCIUM 9.9 9.2 9.2 8.8* 8.5*  MG  --   --   --  2.0  --   PHOS  --   --   --  2.7  --     CBG: No results for input(s): "GLUCAP" in the last 168 hours.  Recent Results (from the past 240 hours)  Culture, blood (Routine X 2) w Reflex to ID Panel     Status: None (Preliminary result)   Collection Time: 08/29/23  4:49 AM   Specimen: BLOOD LEFT HAND  Result Value Ref Range Status   Specimen Description BLOOD LEFT HAND BLOOD  Final   Special Requests   Final    BOTTLES DRAWN AEROBIC AND ANAEROBIC Blood Culture adequate volume   Culture   Final    NO GROWTH 1 DAY Performed at [facility], [address], [place], [place] [address]    Report Status PENDING  Incomplete  Culture, blood (Routine X 2) w Reflex to ID Panel     Status: None (Preliminary result)   Collection Time: 08/29/23  5:00 AM   Specimen: BLOOD LEFT HAND  Result Value Ref Range Status   Specimen Description BLOOD LEFT HAND BLOOD  Final   Special Requests   Final    BOTTLES DRAWN AEROBIC AND ANAEROBIC Blood Culture adequate volume   Culture   Final    NO GROWTH 1 DAY Performed at [facility], [address] [place], [place] [address]    Report Status PENDING  Incomplete  Surgical PCR screen     Status: None   Collection Time: 08/30/23  2:30 AM   Specimen: Nasal Mucosa; Nasal Swab  Result Value Ref Range Status   MRSA, PCR NEGATIVE NEGATIVE Final   Staphylococcus aureus NEGATIVE NEGATIVE Final    Comment: (NOTE) The Xpert SA Assay (FDA approved for NASAL specimens in patients 11 years of age and older), is one component of a comprehensive surveillance  program. It is not intended to diagnose infection nor to guide or monitor treatment. Performed at [facility], [address] [place], [place] [address]    Radiology Studies: US Abdomen Limited Result Date: 08/29/2023 CLINICAL DATA:  6194 Acute cholecystitis 6194 EXAM: ULTRASOUND ABDOMEN LIMITED RIGHT UPPER QUADRANT COMPARISON:  MRI dated August 28, 2023, ultrasound dated August 26, 2013 FINDINGS: Gallbladder: There is gallbladder wall thickening. There is pericholecystic fluid. There is layering internal debris within the distended gallbladder. No sonographic Murphy sign noted by sonographer. Common bile duct:  Diameter: Visualized portion measures 6 mm, within normal limits. Liver: No focal lesion identified. Heterogeneously increased parenchymal echogenicity. Portal vein is patent on color Doppler imaging with normal direction of blood flow towards the liver. Other: None. IMPRESSION: Constellation of findings are consistent with acute cholecystitis as seen on recent MRI. There is increased tumefactive sludge within the gallbladder. Electronically Signed   By: Meda Klinefelter M.D.   On: 08/29/2023 18:10   Scheduled Meds:  acetaminophen  1,000 mg Oral Q6H   diltiazem  180 mg Oral Daily   hydrochlorothiazide  12.5 mg Oral Daily   pantoprazole  40 mg Oral BID AC   polyethylene glycol  17 g Oral Daily   senna-docusate  1 tablet Oral BID   Continuous Infusions:  piperacillin-tazobactam (ZOSYN)  IV 3.375 g (08/30/23 1343)    LOS: 2 days   Time spent: 50 mins  Shonte Beutler Laural Benes, MD How to contact the [facility] Attending or Consulting provider 7A - 7P or covering provider during after hours 7P -7A, for this patient?  Check the care team in [facility] and look for a) attending/consulting TRH provider listed and b) the [facility] team listed Log into [email or web address] to find provider on call.  Locate the [facility] provider you are looking for under [facility] and page to a number that you can be directly  reached. If you still have difficulty reaching the provider, please page the [facility] (Director on Call) for the Hospitalists listed on amion for assistance.  08/30/2023, 3:57 PM

## 2023-08-30 NOTE — Op Note (Signed)
 [facility] Operative Note  08/30/23  Preoperative Diagnosis: Acute cholecystitis, Mirizzi syndrome   Postoperative Diagnosis: Acute gangrenous cholecystitis   Procedure(s) Performed: Robotic Assisted Laparoscopic Cholecystectomy   Surgeon: Theophilus Kinds, DO   Assistants: No qualified resident was available    Anesthesia: General endotracheal   Anesthesiologist: Dr. Johnnette Litter   Specimens: Gallbladder   Estimated Blood Loss: 200 cc   Blood Replacement: None    Complications: None   Wound Class: Dirty/Infected   Operative Indications: Patient was admitted to the hospital with findings of acute cholecystitis.  He initially had elevated LFTs and total bilirubin and underwent MRCP.  MRCP did not demonstrate choledocholithiasis, but was concerning for Mirizzi syndrome.  Given no evidence of choledocholithiasis, will proceed to the operating room for cholecystectomy and monitor LFTs postoperatively.  We discussed the risk of the procedure including but not limited to bleeding, infection, injury to the common bile duct, bile leak, need for further procedures, chance of subtotal cholecystectomy.   Findings:  Acute gangrenous cholecystitis with infundibulum of gallbladder twisting medially Critical view of safety noted All clips intact at the end of the case Adequate hemostasis   Procedure: Firefly was given in the preoperative area. The patient was taken to the operating room and placed supine. General endotracheal anesthesia was induced. Intravenous antibiotics were administered per protocol.  An orogastric tube positioned to decompress the stomach. The abdomen was prepared and draped in the usual sterile fashion. A time-out was completed verifying correct patient, procedure, site, positioning, and implant(s) and/or special equipment prior to beginning this procedure.  Veress needle was placed at the infraumbilical area and insufflation was started after confirming  a positive saline drop test and no immediate increase in abdominal pressure.  After reaching 15 mm, the Veress needle was removed and a 8 mm port was placed via optiview technique infraumbilical, measuring 20 mm away from the suspected position of the gallbladder.  The abdomen was inspected and no abnormalities or injuries were found.  Under direct vision, ports were placed in the following locations in a semi curvilinear position around the target of the gallbladder: Two 8 mm ports on the patient's right each having 8cm clearance to the adjacent ports and one 8 mm port placed on the patient's left 8 cm from the umbilical port. Once ports were placed, the table was placed in the reverse Trendelenburg position with the right side up. The Xi platform was brought into the operative field and docked to the ports successfully.  An endoscope was placed through the umbilical port, prograsp through the most lateral right port, fenestrated bipolar to the port just right of the umbilicus, and then a hook cautery in the left port.  The omentum and transverse colon were adhered over the dome of the gallbladder.  They were bluntly dissected off of the gallbladder dome.  The dome of the gallbladder was grasped with prograsp and retracted over the dome of the liver.  There were significant inflammatory changes surrounding the gallbladder.  Adhesions between the gallbladder and omentum, duodenum and transverse colon were lysed via hook cautery. The infundibulum was grasped with the fenestrated grasper and retracted toward the right lower quadrant. This maneuver exposed Calot's triangle.  The infundibulum of the gallbladder was initially wrapping medially over top of the cystic duct and likely causing the malignancy syndrome that was noted on MRCP.  Firefly was used throughout the dissection to ensure safe visualization of the cystic duct and other biliary ductal structures.  The peritoneum  and inflammatory tissue overlying the  gallbladder infundibulum were then dissected and the cystic duct and cystic artery identified.  Critical view of safety with the liver bed clearly visible behind the duct and artery with no additional structures noted.  The cystic duct and cystic artery were doubly clipped and divided close to the gallbladder.    The gallbladder was then dissected from its peritoneal and liver bed attachments by electrocautery. Hemostasis was checked prior to removing the hook cautery.  Surgical snow and Surgicel powder were placed within the gallbladder fossa.  The [employer] was undocked and moved out of the field.  A 5mm Endo Catch bag was then placed through the umbilical port and the gallbladder was removed.  The gallbladder was passed off the table as a specimen. There was no evidence of bleeding from the gallbladder fossa or cystic artery or leakage of the bile from the cystic duct stump. The umbilical port site closed with a 0 vicryl with a PMI needle.  The abdomen was desufflated and secondary trocars were removed under direct vision. No bleeding was noted. Incisions were localized with marcaine.  All skin incisions were closed with subcuticular sutures of 4-0 monocryl and dermabond.   Final inspection revealed acceptable hemostasis. All counts were correct at the end of the case. The patient was awakened from anesthesia and extubated without complication. The OG tube was removed.  The patient went to the PACU in stable condition.   Theophilus Kinds, DO [facility] [address] [place], [place] [address] [phone] (office)

## 2023-08-30 NOTE — Progress Notes (Addendum)
 [facility]  Spoke with the patient's wife in the consultation room.  I explained that he tolerated the procedure without difficulty.  He has dissolvable stitches under the skin with overlying skin glue.  This will flake off in 10 to 14 days.  He had a significantly inflamed gallbladder, and will be in the hospital at least until tomorrow, possibly longer depending on how he does postoperatively.  I will give him a diet and a bowel regimen and we will recheck blood work tomorrow.  All questions were answered to her expressed satisfaction.  Plan: -Return to room on the floor -Clear liquid diet ordered.  May advance as tolerated to a regular diet -Scheduled Tylenol, as needed oxycodone and Dilaudid -PRN antiemetics -Recheck blood work tomorrow morning -Continue IV Zosyn at this time -Bowel regimen ordered -Recommend continuing to hold Eliquis for at least 2 days.  Previously, especially given 200 cc of blood loss intraoperatively -Appreciate hospitalist recommendations  Theophilus Kinds, DO [facility] [address] [place], [place] [address] [phone] (office)

## 2023-08-30 NOTE — Progress Notes (Signed)
 Patient pain well controlled with prn oxycodone, he had BM this shift and is passing gas. Tolerating regular diet with no nausea at this time.

## 2023-08-30 NOTE — Anesthesia Procedure Notes (Signed)
 Procedure Name: Intubation Date/Time: 08/30/2023 8:23 AM  Performed by: Julian Reil, CRNAPre-anesthesia Checklist: Patient identified, Emergency Drugs available, Suction available and Patient being monitored Patient Re-evaluated:Patient Re-evaluated prior to induction Oxygen Delivery Method: Circle system utilized Preoxygenation: Pre-oxygenation with 100% oxygen Induction Type: IV induction Ventilation: Mask ventilation without difficulty Laryngoscope Size: Miller and 3 Grade View: Grade II Tube type: Oral Tube size: 7.5 mm Number of attempts: 1 Airway Equipment and Method: Stylet Placement Confirmation: ETT inserted through vocal cords under direct vision, positive ETCO2 and breath sounds checked- equal and bilateral Secured at: 23 cm Tube secured with: Tape Dental Injury: Teeth and Oropharynx as per pre-operative assessment

## 2023-08-30 NOTE — Progress Notes (Signed)
 [facility] Progress Note  * Day of Surgery *  Subjective: Patient seen and examined.  He is resting comfortably in bed.  His pain is currently controlled with pain medications.  No other complaints at this time.  Objective: Vital signs in last 24 hours: Temp:  [98.4 F (36.9 C)-99.3 F (37.4 C)] 99.1 F (37.3 C) (02/17 0712) Pulse Rate:  [67-95] 88 (02/17 0712) Resp:  [17-20] 20 (02/17 0712) BP: (99-143)/(62-92) 129/76 (02/17 0712) SpO2:  [91 %-93 %] 93 % (02/17 0712) Weight:  [95.3 kg] 95.3 kg (02/17 0712) Last BM Date :  (patient states a few days ago)  Intake/Output from previous day: 02/16 0701 - 02/17 0700 In: 240 [P.O.:240] Out: 200 [Urine:200] Intake/Output this shift: No intake/output data recorded.  General appearance: alert, cooperative, and no distress GI: abdomen soft, nondistended, no percussion tenderness, mild RUQ TTP; no rigidity, guarding or rebound tenderness; negative murphy's sign  Lab Results:  Recent Labs    08/29/23 0450 08/30/23 0420  WBC 16.2* 14.1*  HGB 14.3 13.1  HCT 41.8 40.2  PLT 225 198   BMET Recent Labs    08/29/23 0450 08/30/23 0420  NA 135 133*  K 3.4* 3.6  CL 98 97*  CO2 24 28  GLUCOSE 99 99  BUN 15 21  CREATININE 0.91 1.09  CALCIUM 8.8* 8.5*   PT/INR No results for input(s): "LABPROT", "INR" in the last 72 hours.  Studies/Results: US Abdomen Limited Result Date: 08/29/2023 CLINICAL DATA:  6194 Acute cholecystitis 6194 EXAM: ULTRASOUND ABDOMEN LIMITED RIGHT UPPER QUADRANT COMPARISON:  MRI dated August 28, 2023, ultrasound dated August 26, 2013 FINDINGS: Gallbladder: There is gallbladder wall thickening. There is pericholecystic fluid. There is layering internal debris within the distended gallbladder. No sonographic Murphy sign noted by sonographer. Common bile duct: Diameter: Visualized portion measures 6 mm, within normal limits. Liver: No focal lesion identified. Heterogeneously increased  parenchymal echogenicity. Portal vein is patent on color Doppler imaging with normal direction of blood flow towards the liver. Other: None. IMPRESSION: Constellation of findings are consistent with acute cholecystitis as seen on recent MRI. There is increased tumefactive sludge within the gallbladder. Electronically Signed   By: Meda Klinefelter M.D.   On: 08/29/2023 18:10   MR ABDOMEN MRCP W WO CONTAST Result Date: 08/28/2023 CLINICAL DATA:  Jaundice, epigastric abdominal pain, cholelithiasis EXAM: MRI ABDOMEN WITHOUT AND WITH CONTRAST (INCLUDING MRCP) TECHNIQUE: Multiplanar multisequence MR imaging of the abdomen was performed both before and after the administration of intravenous contrast. Heavily T2-weighted images of the biliary and pancreatic ducts were obtained, and three-dimensional MRCP images were rendered by post processing. CONTRAST:  9.42mL GADAVIST GADOBUTROL 1 MMOL/ML IV SOLN COMPARISON:  CT 08/27/2023 FINDINGS: Lower chest: Bibasilar atelectasis.  No acute abnormality. Hepatobiliary: The gallbladder is distended. There is extensive layering sludge within the gallbladder as well as a 17 mm gallstone within the gallbladder neck. Sludge distends the gallbladder neck and cystic duct. There is trace submucosal edema within the gallbladder neck, best appreciated on image # 23/4, trace pericholecystic inflammatory fluid best seen adjacent to the fundus at axial image # 16/4 and within the right subhepatic space at image # 25-26/4, in keeping with changes of early acute cholecystitis. The gallbladder wall is intact. The distended cystic duct wraps anterior to common hepatic duct and mass effect results in extrinsic compression and near occlusion of the extrahepatic bile duct, best appreciated on image # 17-20, series # for an MRCP image # 6, series # 12.  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 No significant intra or extrahepatic biliary ductal dilation. No  intraluminal stone identified within the extrahepatic bile duct. Normal hepatic parenchymal echogenicity. No focal intrahepatic mass identified. The hepatic vasculature is unremarkable. Pancreas: The pancreas is atrophic. No focal intraparenchymal mass is identified. There are no acute peripancreatic inflammatory changes or fluid collections identified. The pancreatic duct is not dilated. There is moderate side branch ectasia noted within the body and tail of the pancreas which may reflect the sequela of remote pancreatitis. No pancreatic divisum. Spleen:  Within normal limits in size and appearance. Adrenals/Urinary Tract: The adrenal glands are unremarkable. The kidneys are normal in size and position. Mild bilateral nonspecific perinephric edema. Tiny simple cyst within the upper pole the left kidney for which no follow-up imaging is recommended. The kidneys are otherwise unremarkable. Stomach/Bowel: Visualized portions within the abdomen are unremarkable. Vascular/Lymphatic: No pathologically enlarged lymph nodes identified. No abdominal aortic aneurysm demonstrated. Other:  None. Musculoskeletal: Multiple intra osseous hemangioma are seen within the thoracolumbar spine. No suspicious lytic or blastic bone lesion. IMPRESSION: 1. Distended gallbladder with extensive layering sludge and a 17 mm gallstone within the gallbladder neck. Trace submucosal edema within the gallbladder neck, trace pericholecystic inflammatory fluid, and trace inflammatory fluid within the right subhepatic space, in keeping with changes of early acute cholecystitis. 2. The distended cystic duct wraps anterior to the common hepatic duct and mass effect results in extrinsic compression and near occlusion of the extrahepatic bile duct. 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 No significant intra or extrahepatic biliary ductal dilation. No choledocholithiasis. 3. Atrophic pancreas with moderate  side branch ectasia within the body and tail of the pancreas which may reflect the sequela of remote pancreatitis. No acute peripancreatic inflammatory changes or fluid collections identified. Electronically Signed   By: Helyn Numbers M.D.   On: 08/28/2023 20:44   MR 3D Recon At Scanner Result Date: 08/28/2023 CLINICAL DATA:  Jaundice, epigastric abdominal pain, cholelithiasis EXAM: MRI ABDOMEN WITHOUT AND WITH CONTRAST (INCLUDING MRCP) TECHNIQUE: Multiplanar multisequence MR imaging of the abdomen was performed both before and after the administration of intravenous contrast. Heavily T2-weighted images of the biliary and pancreatic ducts were obtained, and three-dimensional MRCP images were rendered by post processing. CONTRAST:  9.5mL GADAVIST GADOBUTROL 1 MMOL/ML IV SOLN COMPARISON:  CT 08/27/2023 FINDINGS: Lower chest: Bibasilar atelectasis.  No acute abnormality. Hepatobiliary: The gallbladder is distended. There is extensive layering sludge within the gallbladder as well as a 17 mm gallstone within the gallbladder neck. Sludge distends the gallbladder neck and cystic duct. There is trace submucosal edema within the gallbladder neck, best appreciated on image # 23/4, trace pericholecystic inflammatory fluid best seen adjacent to the fundus at axial image # 16/4 and within the right subhepatic space at image # 25-26/4, in keeping with changes of early acute cholecystitis. The gallbladder wall is intact. The distended cystic duct wraps anterior to common hepatic duct and mass effect results in extrinsic compression and near occlusion of the extrahepatic bile duct, best appreciated on image # 17-20, series # for an MRCP image # 6, series # 12. 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 No significant intra or extrahepatic biliary ductal dilation. No intraluminal stone identified within the extrahepatic bile duct. Normal hepatic parenchymal echogenicity. No focal  intrahepatic mass identified. The hepatic vasculature is unremarkable. Pancreas: The pancreas is atrophic. No focal intraparenchymal mass  is identified. There are no acute peripancreatic inflammatory changes or fluid collections identified. The pancreatic duct is not dilated. There is moderate side branch ectasia noted within the body and tail of the pancreas which may reflect the sequela of remote pancreatitis. No pancreatic divisum. Spleen:  Within normal limits in size and appearance. Adrenals/Urinary Tract: The adrenal glands are unremarkable. The kidneys are normal in size and position. Mild bilateral nonspecific perinephric edema. Tiny simple cyst within the upper pole the left kidney for which no follow-up imaging is recommended. The kidneys are otherwise unremarkable. Stomach/Bowel: Visualized portions within the abdomen are unremarkable. Vascular/Lymphatic: No pathologically enlarged lymph nodes identified. No abdominal aortic aneurysm demonstrated. Other:  None. Musculoskeletal: Multiple intra osseous hemangioma are seen within the thoracolumbar spine. No suspicious lytic or blastic bone lesion. IMPRESSION: 1. Distended gallbladder with extensive layering sludge and a 17 mm gallstone within the gallbladder neck. Trace submucosal edema within the gallbladder neck, trace pericholecystic inflammatory fluid, and trace inflammatory fluid within the right subhepatic space, in keeping with changes of early acute cholecystitis. 2. The distended cystic duct wraps anterior to the common hepatic duct and mass effect results in extrinsic compression and near occlusion of the extrahepatic bile duct. 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 No significant intra or extrahepatic biliary ductal dilation. No choledocholithiasis. 3. Atrophic pancreas with moderate side branch ectasia within the body and tail of the pancreas which may reflect the sequela of remote pancreatitis. No  acute peripancreatic inflammatory changes or fluid collections identified. Electronically Signed   By: Helyn Numbers M.D.   On: 08/28/2023 20:44   DG Chest Port 1 View Result Date: 08/28/2023 CLINICAL DATA:  Shortness of breath.  Epigastric pain. EXAM: PORTABLE CHEST 1 VIEW COMPARISON:  08/27/2023. FINDINGS: Low lung volume. Bilateral lung fields are clear. Bilateral costophrenic angles are clear. Stable cardio-mediastinal silhouette. No acute osseous abnormalities. The soft tissues are within normal limits. IMPRESSION: No active disease. Electronically Signed   By: Jules Schick M.D.   On: 08/28/2023 10:33    Anti-infectives: Anti-infectives (From admission, onward)    Start     Dose/Rate Route Frequency Ordered Stop   08/28/23 1200  [MAR Hold]  piperacillin-tazobactam (ZOSYN) IVPB 3.375 g        (MAR Hold since Mon 08/30/2023 at 0703.Hold Reason: Transfer to a Procedural area)   3.375 g 12.5 mL/hr over 240 Minutes Intravenous Every 8 hours 08/28/23 1131         Assessment/Plan:  Patient is a 68 year old male who was admitted with acute cholecystitis with Mirizzi syndrome.  MRCP negative for choledocholithiasis.  -Plan for robotic assisted laparoscopic cholecystectomy today -I counseled the patient about the indication, risks and benefits of robotic assisted laparoscopic cholecystectomy.  He understands there is a small chance for bleeding, infection, injury to normal structures (including common bile duct), conversion to open surgery, persistent symptoms, evolution of postcholecystectomy diarrhea, need for secondary interventions, anesthesia reaction, cardiopulmonary issues and other risks not specifically detailed here. I described the expected recovery, the plan for follow-up and the restrictions during the recovery phase.  All questions were answered. -NPO -Continue IV zosyn -IVF per hospitalist -LFTs downtrending. MRCP negative for choledocholithiasis.  Will monitor  postoperatively -Further recommendations to follow surgery -Appreciate hospitalist recommendations    LOS: 2 days    Mylah Baynes A Jaegar Croft 08/30/2023

## 2023-08-30 NOTE — Plan of Care (Signed)
 Pt given CBG bath and mouth care in prep for surgery. Pt family arrived, at bedside.   Problem: Activity: Goal: Risk for activity intolerance will decrease Outcome: Progressing   Problem: Pain Managment: Goal: General experience of comfort will improve and/or be controlled Outcome: Progressing   Problem: Safety: Goal: Ability to remain free from injury will improve Outcome: Progressing

## 2023-08-30 NOTE — Transfer of Care (Signed)
 Immediate Anesthesia Transfer of Care Note  Patient: Calvin Fernandez  Procedure(s) Performed: XI ROBOTIC ASSISTED LAPAROSCOPIC CHOLECYSTECTOMY (Abdomen)  Patient Location: PACU  Anesthesia Type:General  Level of Consciousness: awake  Airway & Oxygen Therapy: Patient Spontanous Breathing and Patient connected to face mask oxygen  Post-op Assessment: Report given to RN and Post -op Vital signs reviewed and stable  Post vital signs: Reviewed and stable  Last Vitals:  Vitals Value Taken Time  BP 140/62 08/30/23 1100  Temp 37 C 08/30/23 1059  Pulse 78 08/30/23 1100  Resp 26 08/30/23 1100  SpO2 97 % 08/30/23 1100  Vitals shown include unfiled device data.  Last Pain:  Vitals:   08/30/23 2956  TempSrc: Oral  PainSc: 0-No pain      Patients Stated Pain Goal: 0 (08/29/23 0331)  Complications: No notable events documented.

## 2023-08-30 NOTE — Anesthesia Preprocedure Evaluation (Signed)
 Anesthesia Evaluation  Patient identified by MRN, date of birth, ID band Patient awake    Reviewed: Allergy & Precautions, H&P , NPO status , Patient's Chart, lab work & pertinent test results, reviewed documented beta blocker date and time   Airway Mallampati: II  TM Distance: >3 FB Neck ROM: full    Dental no notable dental hx.    Pulmonary neg pulmonary ROS   Pulmonary exam normal breath sounds clear to auscultation       Cardiovascular Exercise Tolerance: Good hypertension, + dysrhythmias Atrial Fibrillation  Rhythm:irregular Rate:Normal     Neuro/Psych negative neurological ROS  negative psych ROS   GI/Hepatic Neg liver ROS,GERD  ,,  Endo/Other  negative endocrine ROS    Renal/GU negative Renal ROS  negative genitourinary   Musculoskeletal   Abdominal   Peds  Hematology negative hematology ROS (+)   Anesthesia Other Findings   Reproductive/Obstetrics negative OB ROS                             Anesthesia Physical Anesthesia Plan  ASA: 3  Anesthesia Plan: General and General ETT   Post-op Pain Management:    Induction:   PONV Risk Score and Plan: Ondansetron  Airway Management Planned:   Additional Equipment:   Intra-op Plan:   Post-operative Plan:   Informed Consent: I have reviewed the patients History and Physical, chart, labs and discussed the procedure including the risks, benefits and alternatives for the proposed anesthesia with the patient or authorized representative who has indicated his/her understanding and acceptance.     Dental Advisory Given  Plan Discussed with: CRNA  Anesthesia Plan Comments:        Anesthesia Quick Evaluation

## 2023-08-31 DIAGNOSIS — K2100 Gastro-esophageal reflux disease with esophagitis, without bleeding: Secondary | ICD-10-CM | POA: Diagnosis not present

## 2023-08-31 DIAGNOSIS — I482 Chronic atrial fibrillation, unspecified: Secondary | ICD-10-CM | POA: Diagnosis not present

## 2023-08-31 DIAGNOSIS — R7401 Elevation of levels of liver transaminase levels: Secondary | ICD-10-CM | POA: Diagnosis not present

## 2023-08-31 DIAGNOSIS — K810 Acute cholecystitis: Secondary | ICD-10-CM | POA: Diagnosis not present

## 2023-08-31 LAB — COMPREHENSIVE METABOLIC PANEL
ALT: 232 U/L — ABNORMAL HIGH (ref 0–44)
AST: 62 U/L — ABNORMAL HIGH (ref 15–41)
Albumin: 2.6 g/dL — ABNORMAL LOW (ref 3.5–5.0)
Alkaline Phosphatase: 90 U/L (ref 38–126)
Anion gap: 11 (ref 5–15)
BUN: 20 mg/dL (ref 8–23)
CO2: 26 mmol/L (ref 22–32)
Calcium: 8.4 mg/dL — ABNORMAL LOW (ref 8.9–10.3)
Chloride: 99 mmol/L (ref 98–111)
Creatinine, Ser: 1.07 mg/dL (ref 0.61–1.24)
GFR, Estimated: >60 mL/min (ref >60)
Glucose, Bld: 116 mg/dL — ABNORMAL HIGH (ref 70–99)
Potassium: 3.8 mmol/L (ref 3.5–5.1)
Sodium: 136 mmol/L (ref 135–145)
Total Bilirubin: 2.6 mg/dL — ABNORMAL HIGH (ref 0.0–1.2)
Total Protein: 6.2 g/dL — ABNORMAL LOW (ref 6.5–8.1)

## 2023-08-31 LAB — CBC WITH DIFFERENTIAL/PLATELET
Abs Immature Granulocytes: 0.07 10*3/uL (ref 0.00–0.07)
Basophils Absolute: 0 10*3/uL (ref 0.0–0.1)
Basophils Relative: 0 %
Eosinophils Absolute: 0 10*3/uL (ref 0.0–0.5)
Eosinophils Relative: 0 %
HCT: 38.4 % — ABNORMAL LOW (ref 39.0–52.0)
Hemoglobin: 12.5 g/dL — ABNORMAL LOW (ref 13.0–17.0)
Immature Granulocytes: 1 %
Lymphocytes Relative: 4 %
Lymphs Abs: 0.6 10*3/uL — ABNORMAL LOW (ref 0.7–4.0)
MCH: 30.7 pg (ref 26.0–34.0)
MCHC: 32.6 g/dL (ref 30.0–36.0)
MCV: 94.3 fL (ref 80.0–100.0)
Monocytes Absolute: 0.8 10*3/uL (ref 0.1–1.0)
Monocytes Relative: 6 %
Neutro Abs: 12.6 10*3/uL — ABNORMAL HIGH (ref 1.7–7.7)
Neutrophils Relative %: 89 %
Platelets: 219 10*3/uL (ref 150–400)
RBC: 4.07 MIL/uL — ABNORMAL LOW (ref 4.22–5.81)
RDW: 12.6 % (ref 11.5–15.5)
WBC: 14.1 10*3/uL — ABNORMAL HIGH (ref 4.0–10.5)
nRBC: 0 % (ref 0.0–0.2)

## 2023-08-31 LAB — LIPASE, BLOOD: Lipase: 23 U/L (ref 11–51)

## 2023-08-31 LAB — SURGICAL PATHOLOGY

## 2023-08-31 MED FILL — Oxycodone w/ Acetaminophen Tab 5-325 MG: ORAL | Qty: 6 | Status: AC

## 2023-08-31 NOTE — Plan of Care (Signed)
   Problem: Activity: Goal: Risk for activity intolerance will decrease Outcome: Progressing   Problem: Pain Managment: Goal: General experience of comfort will improve and/or be controlled Outcome: Progressing   Problem: Safety: Goal: Ability to remain free from injury will improve Outcome: Progressing

## 2023-08-31 NOTE — Progress Notes (Signed)
 [facility] Progress Note  1 Day Post-Op  Subjective: Patient seen and examined.  He is resting comfortably in bed.  He tolerated his diet without nausea and vomiting.  He also had a bowel movement.  He has some abdominal soreness, but states his pain is better than it was yesterday.  Objective: Vital signs in last 24 hours: Temp:  [97.9 F (36.6 C)-98.6 F (37 C)] 97.9 F (36.6 C) (02/18 0424) Pulse Rate:  [63-84] 84 (02/18 0424) Resp:  [17-26] 18 (02/18 0424) BP: (122-146)/(62-103) 136/103 (02/18 0424) SpO2:  [91 %-97 %] 95 % (02/18 0424) Last BM Date : 08/30/23  Intake/Output from previous day: 02/17 0701 - 02/18 0700 In: 1091 [I.V.:800; IV Piggyback:291] Out: 700 [Urine:500; Blood:200] Intake/Output this shift: No intake/output data recorded.  General appearance: alert, cooperative, and no distress GI: Abdomen soft, nondistended, no percussion tenderness, tenderness to palpation in right upper quadrant and right side of abdomen in addition to incisional tenderness; no rigidity, guarding, rebound tenderness; laparoscopic incision sites C/D/I with skin glue in place  Lab Results:  Recent Labs    08/30/23 0420 08/31/23 0300  WBC 14.1* 14.1*  HGB 13.1 12.5*  HCT 40.2 38.4*  PLT 198 219   BMET Recent Labs    08/30/23 0420 08/31/23 0300  NA 133* 136  K 3.6 3.8  CL 97* 99  CO2 28 26  GLUCOSE 99 116*  BUN 21 20  CREATININE 1.09 1.07  CALCIUM 8.5* 8.4*   PT/INR No results for input(s): "LABPROT", "INR" in the last 72 hours.  Studies/Results: US Abdomen Limited Result Date: 08/29/2023 CLINICAL DATA:  6194 Acute cholecystitis 6194 EXAM: ULTRASOUND ABDOMEN LIMITED RIGHT UPPER QUADRANT COMPARISON:  MRI dated August 28, 2023, ultrasound dated August 26, 2013 FINDINGS: Gallbladder: There is gallbladder wall thickening. There is pericholecystic fluid. There is layering internal debris within the distended gallbladder. No sonographic Murphy sign  noted by sonographer. Common bile duct: Diameter: Visualized portion measures 6 mm, within normal limits. Liver: No focal lesion identified. Heterogeneously increased parenchymal echogenicity. Portal vein is patent on color Doppler imaging with normal direction of blood flow towards the liver. Other: None. IMPRESSION: Constellation of findings are consistent with acute cholecystitis as seen on recent MRI. There is increased tumefactive sludge within the gallbladder. Electronically Signed   By: Meda Klinefelter M.D.   On: 08/29/2023 18:10    Anti-infectives: Anti-infectives (From admission, onward)    Start     Dose/Rate Route Frequency Ordered Stop   08/28/23 1200  piperacillin-tazobactam (ZOSYN) IVPB 3.375 g        3.375 g 12.5 mL/hr over 240 Minutes Intravenous Every 8 hours 08/28/23 1131         Assessment/Plan:  Patient is a 68 year old male who was admitted with acute cholecystitis and Mirizzi syndrome.  He is status post robotic assisted laparoscopic cholecystectomy on 2/17.  -Patient still with leukocytosis today, unchanged from yesterday -Will recommend 4 days of antibiotics postop.  Continue Zosyn while inpatient -LFTs improving, T. bili down to 2.6 from 6 -Continue regular diet -Continue to hold Eliquis.  May restart on 2/20 -PRN pain control and antiemetics -Likely plan to keep the patient until tomorrow for lab recheck and to verify pain improving   LOS: 3 days    Jd Mccaster A Keefe Zawistowski 08/31/2023

## 2023-08-31 NOTE — Progress Notes (Signed)
 Progressing well. No pain meds were requested.Laparoscopic sites are clean, dry, and intact. No drainage or foul odors detected. Pt excited to be discharged.

## 2023-08-31 NOTE — Discharge Instructions (Signed)
Surgery Discharge Instructions  Activity  You are advised to go directly home from the hospital.  Resume light activity. No heavy lifting over 10 lbs or strenuous exercise.  Fluids and Diet Regular diet  Medications  If you have not had a bowel movement in 24 hours, take 2 tablespoons over the counter Milk of mag.             You May resume your blood thinners tomorrow (Aspirin, coumadin, or other).  You are being discharged with prescriptions for Opioid/Narcotic Medications: There are some specific considerations for these medications that you should know. Opioid Meds have risks & benefits. Addiction to these meds is always a concern with prolonged use Take medication only as directed Do not drive while taking narcotic pain medication Do not crush tablets or capsules Do not use a different container than medication was dispensed in Lock the container of medication in a cool, dry place out of reach of children and pets. Opioid medication can cause addiction Do not share with anyone else (this is a felony) Do not store medications for future use. Dispose of them properly.     Disposal:  Find a [employer] household drug take back site near you.  If you can't get to a drug take back site, use the recipe below as a last resort to dispose of expired, unused or unwanted drugs. Disposal  (Do not dispose chemotherapy drugs this way, talk to your prescribing doctor instead.) Step 1: Mix drugs (do not crush) with dirt, kitty litter, or used coffee grounds and add a small amount of water to dissolve any solid medications. Step 2: Seal drugs in plastic bag. Step 3: Place plastic bag in trash. Step 4: Take prescription container and scratch out personal information, then recycle or throw away.  Operative Site  You have a liquid bandage over your incisions, this will begin to flake off in about a week. Ok to Games developer. Keep wound clean and dry. No baths or swimming. No lifting more than 10  pounds.  Contact Information: If you have questions or concerns, please call our office, [phone], Monday- Thursday 8AM-5PM and Friday 8AM-12Noon.  If it is after hours or on the weekend, please call [facility] Main [facility], [phone], and ask to speak to the surgeon on call for Dr. Okey Dupre at [facility].   SPECIFIC COMPLICATIONS TO WATCH FOR: Inability to urinate Fever over 101? F by mouth Nausea and vomiting lasting longer than 24 hours. Pain not relieved by medication ordered Swelling around the operative site Increased redness, warmth, hardness, around operative area Numbness, tingling, or cold fingers or toes Blood -soaked dressing, (small amounts of oozing may be normal) Increasing and progressive drainage from surgical area or exam site

## 2023-08-31 NOTE — Progress Notes (Signed)
 PROGRESS NOTE  Calvin Fernandez  ONG:[id number] DOB: 09/24/1955 DOA: 08/28/2023 PCP: Benita Stabile, MD   Chief Complaint  Patient presents with   Abdominal Pain   Level of care: Med-Surg  Brief Admission History:  68 y.o. male with medical history significant of A-fib with RVR on Eliquis, GERD who presents to the emergency department due to right upper quadrant and epigastric pain that has been worsening.  He was seen in the ED earlier today and was noted to have gallstone in the gallbladder, at that time, patient's abdominal pain subsided and was discharged home.  On returning to the ED, liver enzymes have increased (this was only minimally elevated when first seen in the ED earlier today).  He denies fever, chills, nausea, vomiting.    In the emergency department, vital signs were normal.  Workup in the ED showed normal CBC except for WBC of 12.4, BMP showed chloride of 96, blood glucose 132, AST 574, ALT 526, total bilirubin 5.7.  Lipase 91, urinalysis was unimpressive for UTI.  MRI abdomen without and with contrast (including MRCP) showed findings of acute cholecystitis.  Surgery is waiting for apixaban washout and planning for OR lap chole on 08/30/23.     Assessment and Plan:  Acute cholecystitis Postop day 1 status post lap chole 08/30/2023 Continue postop orders per surgeon Continue IV Dilaudid 0.5 mg every 3 hours as needed Continue Zofran as needed Completed IV hydration Continue Zosyn Blood culture obtained, no growth to date  Apixaban being held per surgeon to resume 2/20 General Surgery consult appreciated.  Leukocytosis - WBC remains elevated at 14 - discussed with surgeon - continue IV Zosyn, recheck CBC/diff in AM   Transaminitis and hyperbilirubinemia AST 574, ALT 526, total bilirubin 5.7 Secondary to Mirizzi's syndrome S/p lap chole 08/30/23    Chronic A-fib with RVR Eliquis will be held at this time due to recent surgery Continue Cardizem per home regimen    GERD Continue PPI   DVT prophylaxis: SCDs  Code Status: Full  Family Communication: wife at bedside  Disposition: anticipate home in 1-2 days when ok with surgery    Consultants:   Surgery   Procedures:  lap chole 2/17  Antimicrobials:   Zosyn IV   Subjective: Still having some abdominal pain today but overall has been tolerating diet.   Objective: Vitals:   08/30/23 1720 08/30/23 2121 08/31/23 0424 08/31/23 1253  BP: (!) 146/78 139/80 (!) 136/103 131/88  Pulse: 72 63 84 80  Resp: 18 18 18 19   Temp: 98.4 F (36.9 C) 98.1 F (36.7 C) 97.9 F (36.6 C) (!) 97.5 F (36.4 C)  TempSrc: Oral Oral Oral Oral  SpO2: 91% 96% 95% 92%  Weight:      Height:        Intake/Output Summary (Last 24 hours) at 08/31/2023 1428 Last data filed at 08/31/2023 0416 Gross per 24 hour  Intake --  Output 500 ml  Net -500 ml   Filed Weights   08/28/23 0811 08/30/23 0712  Weight: 95.3 kg 95.3 kg   Examination:  General exam: Appears calm and comfortable  Respiratory system: Clear to auscultation. Respiratory effort normal. Cardiovascular system: normal S1 & S2 heard. No JVD, murmurs, rubs, gallops or clicks. No pedal edema. Gastrointestinal system: Abdomen is nondistended, soft RUQ tenderness with light palpation, wounds c/d/I.  No organomegaly or masses felt. Normal bowel sounds heard. Central nervous system: Alert and oriented. No focal neurological deficits. Extremities: Symmetric 5 x 5 power. Skin:  No rashes, lesions or ulcers. Psychiatry: Judgement and insight appear normal. Mood & affect appropriate.   Data Reviewed: I have personally reviewed following labs and imaging studies  CBC: Recent Labs  Lab 08/28/23 0343 08/28/23 0929 08/28/23 1036 08/29/23 0450 08/30/23 0420 08/31/23 0300  WBC 13.2* 14.8* 12.4* 16.2* 14.1* 14.1*  NEUTROABS 10.4* 12.0* 10.7*  --  11.6* 12.6*  HGB 15.7 16.5 15.3 14.3 13.1 12.5*  HCT 43.9 47.5 43.6 41.8 40.2 38.4*  MCV 89.2 90.8 90.6 93.3  95.0 94.3  PLT 247 267 236 225 198 219    Basic Metabolic Panel: Recent Labs  Lab 08/28/23 0343 08/28/23 1058 08/29/23 0450 08/30/23 0420 08/31/23 0300  NA 134* 135 135 133* 136  K 3.1* 3.8 3.4* 3.6 3.8  CL 99 96* 98 97* 99  CO2 24 27 24 28 26   GLUCOSE 144* 132* 99 99 116*  BUN 14 16 15 21 20   CREATININE 0.92 0.96 0.91 1.09 1.07  CALCIUM 9.2 9.2 8.8* 8.5* 8.4*  MG  --   --  2.0  --   --   PHOS  --   --  2.7  --   --     CBG: No results for input(s): "GLUCAP" in the last 168 hours.  Recent Results (from the past 240 hours)  Culture, blood (Routine X 2) w Reflex to ID Panel     Status: None (Preliminary result)   Collection Time: 08/29/23  4:49 AM   Specimen: BLOOD LEFT HAND  Result Value Ref Range Status   Specimen Description BLOOD LEFT HAND BLOOD  Final   Special Requests   Final    BOTTLES DRAWN AEROBIC AND ANAEROBIC Blood Culture adequate volume   Culture   Final    NO GROWTH 2 DAYS Performed at [facility], [address], [place], [place] [address]    Report Status PENDING  Incomplete  Culture, blood (Routine X 2) w Reflex to ID Panel     Status: None (Preliminary result)   Collection Time: 08/29/23  5:00 AM   Specimen: BLOOD LEFT HAND  Result Value Ref Range Status   Specimen Description BLOOD LEFT HAND BLOOD  Final   Special Requests   Final    BOTTLES DRAWN AEROBIC AND ANAEROBIC Blood Culture adequate volume   Culture   Final    NO GROWTH 2 DAYS Performed at [facility], [address] [place], [place] [address]    Report Status PENDING  Incomplete  Surgical PCR screen     Status: None   Collection Time: 08/30/23  2:30 AM   Specimen: Nasal Mucosa; Nasal Swab  Result Value Ref Range Status   MRSA, PCR NEGATIVE NEGATIVE Final   Staphylococcus aureus NEGATIVE NEGATIVE Final    Comment: (NOTE) The Xpert SA Assay (FDA approved for NASAL specimens in patients 35 years of age and older), is one component of a comprehensive surveillance program. It is not  intended to diagnose infection nor to guide or monitor treatment. Performed at [facility], [address], [place], [place] [address]    Radiology Studies: US Abdomen Limited Result Date: 08/29/2023 CLINICAL DATA:  6194 Acute cholecystitis 6194 EXAM: ULTRASOUND ABDOMEN LIMITED RIGHT UPPER QUADRANT COMPARISON:  MRI dated August 28, 2023, ultrasound dated August 26, 2013 FINDINGS: Gallbladder: There is gallbladder wall thickening. There is pericholecystic fluid. There is layering internal debris within the distended gallbladder. No sonographic Murphy sign noted by sonographer. Common bile duct: Diameter: Visualized portion measures 6 mm, within normal  limits. Liver: No focal lesion identified. Heterogeneously increased parenchymal echogenicity. Portal vein is patent on color Doppler imaging with normal direction of blood flow towards the liver. Other: None. IMPRESSION: Constellation of findings are consistent with acute cholecystitis as seen on recent MRI. There is increased tumefactive sludge within the gallbladder. Electronically Signed   By: Meda Klinefelter M.D.   On: 08/29/2023 18:10   Scheduled Meds:  acetaminophen  1,000 mg Oral Q6H   diltiazem  180 mg Oral Daily   hydrochlorothiazide  12.5 mg Oral Daily   pantoprazole  40 mg Oral BID AC   polyethylene glycol  17 g Oral Daily   senna-docusate  1 tablet Oral BID   Continuous Infusions:  piperacillin-tazobactam (ZOSYN)  IV 3.375 g (08/31/23 1258)    LOS: 3 days   Time spent: 50 mins  Karin Pinedo Laural Benes, MD How to contact the [facility] Attending or Consulting provider 7A - 7P or covering provider during after hours 7P -7A, for this patient?  Check the care team in [facility] and look for a) attending/consulting [facility] provider listed and b) the [facility] team listed Log into [email or web address] to find provider on call.  Locate the [facility] provider you are looking for under [facility] and page to a number that you can be directly reached. If you still  have difficulty reaching the provider, please page the [facility] (Director on Call) for the Hospitalists listed on amion for assistance.  08/31/2023, 2:28 PM

## 2023-09-01 DIAGNOSIS — K8001 Calculus of gallbladder with acute cholecystitis with obstruction: Secondary | ICD-10-CM | POA: Diagnosis not present

## 2023-09-01 DIAGNOSIS — I480 Paroxysmal atrial fibrillation: Secondary | ICD-10-CM

## 2023-09-01 DIAGNOSIS — R7401 Elevation of levels of liver transaminase levels: Secondary | ICD-10-CM | POA: Diagnosis not present

## 2023-09-01 LAB — CBC WITH DIFFERENTIAL/PLATELET
Abs Immature Granulocytes: 0.03 10*3/uL (ref 0.00–0.07)
Basophils Absolute: 0 10*3/uL (ref 0.0–0.1)
Basophils Relative: 0 %
Eosinophils Absolute: 0.1 10*3/uL (ref 0.0–0.5)
Eosinophils Relative: 1 %
HCT: 34.3 % — ABNORMAL LOW (ref 39.0–52.0)
Hemoglobin: 11.8 g/dL — ABNORMAL LOW (ref 13.0–17.0)
Immature Granulocytes: 0 %
Lymphocytes Relative: 13 %
Lymphs Abs: 1.3 10*3/uL (ref 0.7–4.0)
MCH: 32.2 pg (ref 26.0–34.0)
MCHC: 34.4 g/dL (ref 30.0–36.0)
MCV: 93.5 fL (ref 80.0–100.0)
Monocytes Absolute: 0.8 10*3/uL (ref 0.1–1.0)
Monocytes Relative: 8 %
Neutro Abs: 8.1 10*3/uL — ABNORMAL HIGH (ref 1.7–7.7)
Neutrophils Relative %: 78 %
Platelets: 261 10*3/uL (ref 150–400)
RBC: 3.67 MIL/uL — ABNORMAL LOW (ref 4.22–5.81)
RDW: 12.9 % (ref 11.5–15.5)
WBC: 10.3 10*3/uL (ref 4.0–10.5)
nRBC: 0 % (ref 0.0–0.2)

## 2023-09-01 LAB — COMPREHENSIVE METABOLIC PANEL
ALT: 146 U/L — ABNORMAL HIGH (ref 0–44)
AST: 31 U/L (ref 15–41)
Albumin: 2.5 g/dL — ABNORMAL LOW (ref 3.5–5.0)
Alkaline Phosphatase: 69 U/L (ref 38–126)
Anion gap: 8 (ref 5–15)
BUN: 20 mg/dL (ref 8–23)
CO2: 27 mmol/L (ref 22–32)
Calcium: 8 mg/dL — ABNORMAL LOW (ref 8.9–10.3)
Chloride: 102 mmol/L (ref 98–111)
Creatinine, Ser: 1.07 mg/dL (ref 0.61–1.24)
GFR, Estimated: >60 mL/min (ref >60)
Glucose, Bld: 108 mg/dL — ABNORMAL HIGH (ref 70–99)
Potassium: 3.1 mmol/L — ABNORMAL LOW (ref 3.5–5.1)
Sodium: 137 mmol/L (ref 135–145)
Total Bilirubin: 1.6 mg/dL — ABNORMAL HIGH (ref 0.0–1.2)
Total Protein: 5.8 g/dL — ABNORMAL LOW (ref 6.5–8.1)

## 2023-09-01 MED ORDER — AMOXICILLIN-POT CLAVULANATE 875-125 MG PO TABS
1.0000 | ORAL_TABLET | Freq: Two times a day (BID) | ORAL | Status: DC
  Administered 2023-09-01: 1 via ORAL
  Filled 2023-09-01: qty 1

## 2023-09-01 MED ORDER — ORAL CARE MOUTH RINSE: 15.0000 mL | OROMUCOSAL | Status: DC | PRN

## 2023-09-01 MED ORDER — APIXABAN 5 MG PO TABS: 5.0000 mg | ORAL_TABLET | Freq: Two times a day (BID) | ORAL | Status: DC

## 2023-09-01 MED ORDER — OXYCODONE HCL 5 MG PO TABS
5.0000 mg | ORAL_TABLET | Freq: Four times a day (QID) | ORAL | 0 refills | Status: DC | PRN
Start: 2023-09-01 — End: 2024-05-08

## 2023-09-01 MED ORDER — DOCUSATE SODIUM 100 MG PO CAPS
100.0000 mg | ORAL_CAPSULE | Freq: Two times a day (BID) | ORAL | 2 refills | Status: DC
Start: 2023-09-01 — End: 2024-05-08

## 2023-09-01 MED ORDER — AMOXICILLIN-POT CLAVULANATE 500-125 MG PO TABS
1.0000 | ORAL_TABLET | Freq: Three times a day (TID) | ORAL | 0 refills | Status: AC
Start: 2023-09-01 — End: 2023-09-03

## 2023-09-01 MED ORDER — ACETAMINOPHEN 500 MG PO TABS: 1000.0000 mg | ORAL_TABLET | Freq: Four times a day (QID) | ORAL | 0 refills | Status: AC

## 2023-09-01 NOTE — Progress Notes (Signed)
 [facility] Progress Note  2 Days Post-Op  Subjective: Patient seen and examined.  He is sitting in chair at the side of the bed.  He tolerated his diet without nausea and vomiting.  His abdominal pain is continuing to improve.  He confirms passing flatus and had a couple of bowel movements yesterday.  He is agreeable to going home today.  Objective: Vital signs in last 24 hours: Temp:  [97.5 F (36.4 C)-98.3 F (36.8 C)] 97.8 F (36.6 C) (02/19 0336) Pulse Rate:  [58-80] 58 (02/19 0336) Resp:  [16-19] 16 (02/19 0336) BP: (128-131)/(80-88) 130/80 (02/19 0336) SpO2:  [92 %-95 %] 94 % (02/19 0336) Last BM Date : 08/31/23  Intake/Output from previous day: 02/18 0701 - 02/19 0700 In: 240 [P.O.:240] Out: -  Intake/Output this shift: No intake/output data recorded.  General appearance: alert, cooperative, and no distress GI: Abdomen soft, nondistended, no percussion tenderness, mild incisional tenderness to palpation; no rigidity, guarding, rebound tenderness; laparoscopic incision sites C/D/I with Dermabond in place  Lab Results:  Recent Labs    08/31/23 0300 09/01/23 0335  WBC 14.1* 10.3  HGB 12.5* 11.8*  HCT 38.4* 34.3*  PLT 219 261   BMET Recent Labs    08/31/23 0300 09/01/23 0335  NA 136 137  K 3.8 3.1*  CL 99 102  CO2 26 27  GLUCOSE 116* 108*  BUN 20 20  CREATININE 1.07 1.07  CALCIUM 8.4* 8.0*   PT/INR No results for input(s): "LABPROT", "INR" in the last 72 hours.  Studies/Results: No results found.  Anti-infectives: Anti-infectives (From admission, onward)    Start     Dose/Rate Route Frequency Ordered Stop   08/28/23 1200  piperacillin-tazobactam (ZOSYN) IVPB 3.375 g        3.375 g 12.5 mL/hr over 240 Minutes Intravenous Every 8 hours 08/28/23 1131         Assessment/Plan:  Patient is a 68 year old male who was admitted with acute cholecystitis and Mirizzi syndrome.  He is status post robotic assisted laparoscopic  cholecystectomy on 2/17.  -Leukocytosis has resolved this morning -Will need 4 days of antibiotics postoperatively.  Would recommend discharge home with Augmentin -LFTs continue to improve and total bilirubin also has improved to 1.6 -Continue regular diet -Continue to hold Eliquis.  May restart on 2/20 -As needed pain medications and antiemetics -Patient stable for discharge home today from surgical standpoint   LOS: 4 days    Alex Leahy A Maize Brittingham 09/01/2023

## 2023-09-01 NOTE — Discharge Summary (Signed)
 Physician Discharge Summary   Patient: Calvin Fernandez MRN: [id number] DOB: 03-30-1956  Admit date:     08/28/2023  Discharge date: 09/01/23  Discharge Physician: Onalee Hua Maeleigh Buschman   PCP: Benita Stabile, MD   Recommendations at discharge:   Please follow up with primary care provider within 1-2 weeks  Please repeat LFTs and CBC in one week   Hospital Course: 68 y.o. male with medical history significant of A-fib with RVR on Eliquis, GERD who presents to the emergency department due to right upper quadrant and epigastric pain that has been worsening.  He was seen in the ED earlier today and was noted to have gallstone in the gallbladder, at that time, patient's abdominal pain subsided and was discharged home.  On returning to the ED, liver enzymes have increased (this was only minimally elevated when first seen in the ED earlier today).  He denies fever, chills, nausea, vomiting.    In the emergency department, vital signs were normal.  Workup in the ED showed normal CBC except for WBC of 12.4, BMP showed chloride of 96, blood glucose 132, AST 574, ALT 526, total bilirubin 5.7.  Lipase 91, urinalysis was unimpressive for UTI.  MRI abdomen without and with contrast (including MRCP) showed findings of acute cholecystitis.  Surgery is waiting for apixaban washout and planning for OR lap chole on 08/30/23.     Assessment and Plan: Acute cholecystitis Postop day 1 status post lap chole 08/30/2023 Continue postop orders per surgeon Continue IV Dilaudid 0.5 mg every 3 hours as needed>>percocet 5/325 for d/c home Continue Zofran as needed Completed IV hydration Continue Zosyn post op--d/c home with amox/clav x 2 more days Blood culture obtained, no growth to date  Apixaban being held per surgeon to resume 09/02/23 General Surgery consult appreciated. Tolerating diet at time of d/c   Leukocytosis - WBC initially 16.2 - continue IV Zosyn post op -WBC improved to 10.3 on day of dc -pt afebrile and  hemodynamically stable   Transaminitis and hyperbilirubinemia AST 574, ALT 526, total bilirubin 5.7 Secondary to Mirizzi's syndrome S/p lap chole 08/30/23  -continues to improve post op with AST 31 and ALT 146  and total bili 1.6 on day of d/c   paroxysmal A-fib with RVR Eliquis will be held at this time due to recent surgery--restart on 09/02/23 Continue Cardizem per home regimen -in sinus at time of d/c   GERD Continue PPI      Consultants: general surgery Procedures performed: lap chole 2/17  Disposition: Home Diet recommendation:  Cardiac diet DISCHARGE MEDICATION: Allergies as of 09/01/2023       Reactions   Tetanus Toxoid Rash        Medication List     STOP taking these medications    oxyCODONE-acetaminophen 5-325 MG tablet Commonly known as: Percocet       TAKE these medications    acetaminophen 500 MG tablet Commonly known as: TYLENOL Take 2 tablets (1,000 mg total) by mouth every 6 (six) hours for 7 days.   amoxicillin-clavulanate 500-125 MG tablet Commonly known as: Augmentin Take 1 tablet by mouth 3 (three) times daily for 2 days.   apixaban 5 MG Tabs tablet Commonly known as: ELIQUIS Take 1 tablet (5 mg total) by mouth 2 (two) times daily. Restart 09/02/23 What changed: additional instructions   cetirizine 10 MG tablet Commonly known as: ZYRTEC Take 10 mg by mouth daily.   diltiazem 180 MG 24 hr capsule Commonly known as: Cardizem CD Take  1 capsule (180 mg total) by mouth daily.   docusate sodium 100 MG capsule Commonly known as: Colace Take 1 capsule (100 mg total) by mouth 2 (two) times daily.   hydrochlorothiazide 12.5 MG tablet Commonly known as: HYDRODIURIL Take 1 tablet (12.5 mg total) by mouth 2 (two) times daily.   ondansetron 4 MG disintegrating tablet Commonly known as: ZOFRAN-ODT 4mg  ODT q4 hours prn nausea/vomit What changed:  how much to take how to take this when to take this reasons to take this   oxyCODONE 5  MG immediate release tablet Commonly known as: Roxicodone Take 1 tablet (5 mg total) by mouth every 6 (six) hours as needed.   pantoprazole 40 MG tablet Commonly known as: Protonix Take 1 tablet (40 mg total) by mouth 2 (two) times daily before a meal.   sucralfate 1 GM/10ML suspension Commonly known as: Carafate Take 10 mLs (1 g total) by mouth 4 (four) times daily -  with meals and at bedtime.        Follow-up Information     Pappayliou, Gustavus Messing, DO. Call.   Specialty: General Surgery Why: Call to schedule a follow up appointment in 2 weeks Contact information: [id number] [place] [facility] [id number] [phone]                Discharge Exam: Filed Weights   08/28/23 0811 08/30/23 0712  Weight: 95.3 kg 95.3 kg   HEENT:  [place]/AT, No thrush, no icterus CV:  RRR, no rub, no S3, no S4 Lung:  CTA, no wheeze, no rhonchi Abd:  soft/+BS, NT Ext:  No edema, no lymphangitis, no synovitis, no rash   Condition at discharge: stable  The results of significant diagnostics from this hospitalization (including imaging, microbiology, ancillary and laboratory) are listed below for reference.   Imaging Studies: US Abdomen Limited Result Date: 08/29/2023 CLINICAL DATA:  [id number] Acute cholecystitis 6194 EXAM: ULTRASOUND ABDOMEN LIMITED RIGHT UPPER QUADRANT COMPARISON:  MRI dated August 28, 2023, ultrasound dated August 26, 2013 FINDINGS: Gallbladder: There is gallbladder wall thickening. There is pericholecystic fluid. There is layering internal debris within the distended gallbladder. No sonographic Murphy sign noted by sonographer. Common bile duct: Diameter: Visualized portion measures 6 mm, within normal limits. Liver: No focal lesion identified. Heterogeneously increased parenchymal echogenicity. Portal vein is patent on color Doppler imaging with normal direction of blood flow towards the liver. Other: None. IMPRESSION: Constellation of findings are consistent with acute  cholecystitis as seen on recent MRI. There is increased tumefactive sludge within the gallbladder. Electronically Signed   By: Meda Klinefelter M.D.   On: 08/29/2023 18:10   MR ABDOMEN MRCP W WO CONTAST Result Date: 08/28/2023 CLINICAL DATA:  Jaundice, epigastric abdominal pain, cholelithiasis EXAM: MRI ABDOMEN WITHOUT AND WITH CONTRAST (INCLUDING MRCP) TECHNIQUE: Multiplanar multisequence MR imaging of the abdomen was performed both before and after the administration of intravenous contrast. Heavily T2-weighted images of the biliary and pancreatic ducts were obtained, and three-dimensional MRCP images were rendered by post processing. CONTRAST:  9.58mL GADAVIST GADOBUTROL 1 MMOL/ML IV SOLN COMPARISON:  CT 08/27/2023 FINDINGS: Lower chest: Bibasilar atelectasis.  No acute abnormality. Hepatobiliary: The gallbladder is distended. There is extensive layering sludge within the gallbladder as well as a 17 mm gallstone within the gallbladder neck. Sludge distends the gallbladder neck and cystic duct. There is trace submucosal edema within the gallbladder neck, best appreciated on image # 23/4, trace pericholecystic inflammatory fluid best seen adjacent to the fundus at axial image # 16/4  and within the right subhepatic space at image # 25-26/4, in keeping with changes of early acute cholecystitis. The gallbladder wall is intact. The distended cystic duct wraps anterior to common hepatic duct and mass effect results in extrinsic compression and near occlusion of the extrahepatic bile duct, best appreciated on image # 17-20, series # for an MRCP image # 6, series # 12. 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 No significant intra or extrahepatic biliary ductal dilation. No intraluminal stone identified within the extrahepatic bile duct. Normal hepatic parenchymal echogenicity. No focal intrahepatic mass identified. The hepatic vasculature is unremarkable. Pancreas: The  pancreas is atrophic. No focal intraparenchymal mass is identified. There are no acute peripancreatic inflammatory changes or fluid collections identified. The pancreatic duct is not dilated. There is moderate side branch ectasia noted within the body and tail of the pancreas which may reflect the sequela of remote pancreatitis. No pancreatic divisum. Spleen:  Within normal limits in size and appearance. Adrenals/Urinary Tract: The adrenal glands are unremarkable. The kidneys are normal in size and position. Mild bilateral nonspecific perinephric edema. Tiny simple cyst within the upper pole the left kidney for which no follow-up imaging is recommended. The kidneys are otherwise unremarkable. Stomach/Bowel: Visualized portions within the abdomen are unremarkable. Vascular/Lymphatic: No pathologically enlarged lymph nodes identified. No abdominal aortic aneurysm demonstrated. Other:  None. Musculoskeletal: Multiple intra osseous hemangioma are seen within the thoracolumbar spine. No suspicious lytic or blastic bone lesion. IMPRESSION: 1. Distended gallbladder with extensive layering sludge and a 17 mm gallstone within the gallbladder neck. Trace submucosal edema within the gallbladder neck, trace pericholecystic inflammatory fluid, and trace inflammatory fluid within the right subhepatic space, in keeping with changes of early acute cholecystitis. 2. The distended cystic duct wraps anterior to the common hepatic duct and mass effect results in extrinsic compression and near occlusion of the extrahepatic bile duct. 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 No significant intra or extrahepatic biliary ductal dilation. No choledocholithiasis. 3. Atrophic pancreas with moderate side branch ectasia within the body and tail of the pancreas which may reflect the sequela of remote pancreatitis. No acute peripancreatic inflammatory changes or fluid collections identified.  Electronically Signed   By: Helyn Numbers M.D.   On: 08/28/2023 20:44   MR 3D Recon At Scanner Result Date: 08/28/2023 CLINICAL DATA:  Jaundice, epigastric abdominal pain, cholelithiasis EXAM: MRI ABDOMEN WITHOUT AND WITH CONTRAST (INCLUDING MRCP) TECHNIQUE: Multiplanar multisequence MR imaging of the abdomen was performed both before and after the administration of intravenous contrast. Heavily T2-weighted images of the biliary and pancreatic ducts were obtained, and three-dimensional MRCP images were rendered by post processing. CONTRAST:  9.39mL GADAVIST GADOBUTROL 1 MMOL/ML IV SOLN COMPARISON:  CT 08/27/2023 FINDINGS: Lower chest: Bibasilar atelectasis.  No acute abnormality. Hepatobiliary: The gallbladder is distended. There is extensive layering sludge within the gallbladder as well as a 17 mm gallstone within the gallbladder neck. Sludge distends the gallbladder neck and cystic duct. There is trace submucosal edema within the gallbladder neck, best appreciated on image # 23/4, trace pericholecystic inflammatory fluid best seen adjacent to the fundus at axial image # 16/4 and within the right subhepatic space at image # 25-26/4, in keeping with changes of early acute cholecystitis. The gallbladder wall is intact. The distended cystic duct wraps anterior to common hepatic duct and mass effect results in extrinsic compression and near occlusion of the extrahepatic bile duct, best appreciated on image #  17-20, series # for an MRCP image # 6, series # 12. 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 No significant intra or extrahepatic biliary ductal dilation. No intraluminal stone identified within the extrahepatic bile duct. Normal hepatic parenchymal echogenicity. No focal intrahepatic mass identified. The hepatic vasculature is unremarkable. Pancreas: The pancreas is atrophic. No focal intraparenchymal mass is identified. There are no acute peripancreatic  inflammatory changes or fluid collections identified. The pancreatic duct is not dilated. There is moderate side branch ectasia noted within the body and tail of the pancreas which may reflect the sequela of remote pancreatitis. No pancreatic divisum. Spleen:  Within normal limits in size and appearance. Adrenals/Urinary Tract: The adrenal glands are unremarkable. The kidneys are normal in size and position. Mild bilateral nonspecific perinephric edema. Tiny simple cyst within the upper pole the left kidney for which no follow-up imaging is recommended. The kidneys are otherwise unremarkable. Stomach/Bowel: Visualized portions within the abdomen are unremarkable. Vascular/Lymphatic: No pathologically enlarged lymph nodes identified. No abdominal aortic aneurysm demonstrated. Other:  None. Musculoskeletal: Multiple intra osseous hemangioma are seen within the thoracolumbar spine. No suspicious lytic or blastic bone lesion. IMPRESSION: 1. Distended gallbladder with extensive layering sludge and a 17 mm gallstone within the gallbladder neck. Trace submucosal edema within the gallbladder neck, trace pericholecystic inflammatory fluid, and trace inflammatory fluid within the right subhepatic space, in keeping with changes of early acute cholecystitis. 2. The distended cystic duct wraps anterior to the common hepatic duct and mass effect results in extrinsic compression and near occlusion of the extrahepatic bile duct. 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 No significant intra or extrahepatic biliary ductal dilation. No choledocholithiasis. 3. Atrophic pancreas with moderate side branch ectasia within the body and tail of the pancreas which may reflect the sequela of remote pancreatitis. No acute peripancreatic inflammatory changes or fluid collections identified. Electronically Signed   By: Helyn Numbers M.D.   On: 08/28/2023 20:44   DG Chest Port 1 View Result Date:  08/28/2023 CLINICAL DATA:  Shortness of breath.  Epigastric pain. EXAM: PORTABLE CHEST 1 VIEW COMPARISON:  08/27/2023. FINDINGS: Low lung volume. Bilateral lung fields are clear. Bilateral costophrenic angles are clear. Stable cardio-mediastinal silhouette. No acute osseous abnormalities. The soft tissues are within normal limits. IMPRESSION: No active disease. Electronically Signed   By: Jules Schick M.D.   On: 08/28/2023 10:33   US Abdomen Limited RUQ (LIVER/GB) Result Date: 08/27/2023 CLINICAL DATA:  [id number] Abdominal pain [id number] EXAM: ULTRASOUND ABDOMEN LIMITED RIGHT UPPER QUADRANT COMPARISON:  None Available. FINDINGS: Gallbladder: Physiologically distended. No abnormal wall thickening. No pericholecystic free fluid. There is a single 2.5 cm calculus in the neck region. Sonographic Murphy's sign was negative as per the technologist. Common bile duct: Diameter: Up to 5.4 mm.  No intrahepatic bile duct dilation. Liver: There is increased hepatic echogenicity which reduces the sensitivity of ultrasound for the detection of focal masses. That being said, no focal mass is identified. Portal vein is patent on color Doppler imaging with normal direction of blood flow towards the liver. Other: None. IMPRESSION: 1. There is a 2.5 cm gallstone without imaging signs of acute cholecystitis. 2. Increased hepatic echogenicity, a nonspecific finding that is most commonly seen on the basis of steatosis in the absence of known liver disease. 3. Otherwise unremarkable exam. Electronically Signed   By: Jules Schick M.D.   On: 08/27/2023 09:44   DG Chest 2 View Result Date:  08/27/2023 CLINICAL DATA:  Evaluation for pain EXAM: CHEST - 2 VIEW COMPARISON:  06/18/2023 FINDINGS: Stable cardiomediastinal silhouette. Low lung volumes accentuate pulmonary vascularity. No focal consolidation, pleural effusion, or pneumothorax. No displaced rib fractures. IMPRESSION: No active cardiopulmonary disease. Electronically Signed   By:  Minerva Fester M.D.   On: 08/27/2023 03:54   CT ABDOMEN PELVIS W CONTRAST Result Date: 08/27/2023 CLINICAL DATA:  Epigastric abdominal pain EXAM: CT ABDOMEN AND PELVIS WITH CONTRAST TECHNIQUE: Multidetector CT imaging of the abdomen and pelvis was performed using the standard protocol following bolus administration of intravenous contrast. RADIATION DOSE REDUCTION: This exam was performed according to the departmental dose-optimization program which includes automated exposure control, adjustment of the mA and/or kV according to patient size and/or use of iterative reconstruction technique. CONTRAST:  OMNIPAQUE IOHEXOL 300 MG/ML  SOLN COMPARISON:  03/13/2010 FINDINGS: Lower chest: No acute abnormality. Hepatobiliary: The gallbladder is distended, however common no superimposed acute inflammatory changes are identified. Mild hepatic steatosis. No enhancing intrahepatic mass. No intra or extrahepatic biliary ductal dilation. Pancreas: Unremarkable Spleen: Unremarkable Adrenals/Urinary Tract: The adrenal glands are unremarkable. The kidneys are normal in size and position. 5 mm nonobstructing calculus noted within the right kidney. The kidneys are otherwise unremarkable. No hydronephrosis. No ureteral calculi. The bladder is decompressed. Stomach/Bowel: Stomach is within normal limits. Appendix appears normal. No evidence of bowel wall thickening, distention, or inflammatory changes. Vascular/Lymphatic: Aortic atherosclerosis. No enlarged abdominal or pelvic lymph nodes. Reproductive: Moderate prostatic hypertrophy Other: No abdominal wall hernia or abnormality. No abdominopelvic ascites. Musculoskeletal: Status post right hip pinning. No acute bone abnormality. No lytic or blastic bone lesion. Osseous structures are age-appropriate. IMPRESSION: 1. No acute intra-abdominal pathology identified. No definite radiographic explanation for the patient's reported symptoms. 2. Mild hepatic steatosis. 3. 5 mm  nonobstructing right renal calculus. No ureteral calculi. No hydronephrosis. 4. Moderate prostatic hypertrophy. Aortic Atherosclerosis (ICD10-I70.0). Electronically Signed   By: Helyn Numbers M.D.   On: 08/27/2023 03:48    Microbiology: Results for orders placed or performed during the hospital encounter of 08/28/23  Culture, blood (Routine X 2) w Reflex to ID Panel     Status: None (Preliminary result)   Collection Time: 08/29/23  4:49 AM   Specimen: BLOOD LEFT HAND  Result Value Ref Range Status   Specimen Description BLOOD LEFT HAND BLOOD  Final   Special Requests   Final    BOTTLES DRAWN AEROBIC AND ANAEROBIC Blood Culture adequate volume   Culture   Final    NO GROWTH 3 DAYS Performed at [facility], [address], [place], [place] [address]    Report Status PENDING  Incomplete  Culture, blood (Routine X 2) w Reflex to ID Panel     Status: None (Preliminary result)   Collection Time: 08/29/23  5:00 AM   Specimen: BLOOD LEFT HAND  Result Value Ref Range Status   Specimen Description BLOOD LEFT HAND BLOOD  Final   Special Requests   Final    BOTTLES DRAWN AEROBIC AND ANAEROBIC Blood Culture adequate volume   Culture   Final    NO GROWTH 3 DAYS Performed at [facility], [address] [place], [place] [address]    Report Status PENDING  Incomplete  Surgical PCR screen     Status: None   Collection Time: 08/30/23  2:30 AM   Specimen: Nasal Mucosa; Nasal Swab  Result Value Ref Range Status   MRSA, PCR NEGATIVE NEGATIVE Final   Staphylococcus aureus NEGATIVE NEGATIVE Final  Comment: (NOTE) The Xpert SA Assay (FDA approved for NASAL specimens in patients 48 years of age and older), is one component of a comprehensive surveillance program. It is not intended to diagnose infection nor to guide or monitor treatment. Performed at [facility], [address] [place], [place] [address]     Labs: CBC: Recent Labs  Lab 08/28/23 [phone] 08/28/23 1036 08/29/23 0450  08/30/23 0420 08/31/23 0300 09/01/23 0335  WBC 14.8* 12.4* 16.2* 14.1* 14.1* 10.3  NEUTROABS 12.0* 10.7*  --  11.6* 12.6* 8.1*  HGB 16.5 15.3 14.3 13.1 12.5* 11.8*  HCT 47.5 43.6 41.8 40.2 38.4* 34.3*  MCV 90.8 90.6 93.3 95.0 94.3 93.5  PLT 267 236 225 198 219 261   Basic Metabolic Panel: Recent Labs  Lab 08/28/23 1058 08/29/23 0450 08/30/23 0420 08/31/23 0300 09/01/23 0335  NA 135 135 133* 136 137  K 3.8 3.4* 3.6 3.8 3.1*  CL 96* 98 97* 99 102  CO2 27 24 28 26 27   GLUCOSE 132* 99 99 116* 108*  BUN 16 15 21 20 20   CREATININE 0.96 0.91 1.09 1.07 1.07  CALCIUM 9.2 8.8* 8.5* 8.4* 8.0*  MG  --  2.0  --   --   --   PHOS  --  2.7  --   --   --    Liver Function Tests: Recent Labs  Lab 08/28/23 1058 08/29/23 0450 08/30/23 0420 08/31/23 0300 09/01/23 0335  AST 574* 404* 136* 62* 31  ALT 526* 615* 356* 232* 146*  ALKPHOS 102 128* 112 90 69  BILITOT 5.7* 8.1* 6.0* 2.6* 1.6*  PROT 7.3 6.7 6.3* 6.2* 5.8*  ALBUMIN 3.8 3.4* 3.0* 2.6* 2.5*   CBG: No results for input(s): "GLUCAP" in the last 168 hours.  Discharge time spent: greater than 30 minutes.  Signed: Catarina Hartshorn, MD [facility] 09/01/2023

## 2023-09-01 NOTE — Care Management Important Message (Signed)
 Important Message  Patient Details  Name: Calvin Fernandez MRN: [id number] Date of Birth: June 03, 1956   Important Message Given:  Yes - Medicare IM     Corey Harold 09/01/2023, 10:42 AM

## 2023-09-01 NOTE — TOC Transition Note (Signed)
 Transition of Care [employer] [facility]) - Discharge Note   Patient Details  Name: SCOTTY WEIGELT MRN: [id number] Date of Birth: 03/31/1956  [facility] CM/SW Contact:  Leitha Bleak, RN Phone Number: 09/01/2023, 10:30 AM   Clinical Narrative:   TOC offering home health and DME. Spoke with her wife, They have no needs at home. Patient is doing well and ready for discharge.    Final next level of care: Home/Self Care Barriers to Discharge: No Barriers Identified   Patient Goals and CMS Choice Patient states their goals for this hospitalization and ongoing recovery are:: return home. CMS Medicare.gov Compare Post Acute Care list provided to:: Patient Choice offered to / list presented to : Patient        Discharge Plan and Services Additional resources added to the After Visit Summary for        Social Drivers of Health (SDOH) Interventions SDOH Screenings   Food Insecurity: No Food Insecurity (08/28/2023)  Housing: Low Risk  (08/28/2023)  Transportation Needs: No Transportation Needs (08/28/2023)  Utilities: Not At Risk (08/28/2023)  Social Connections: Moderately Integrated (08/28/2023)  Tobacco Use: Low Risk  (08/30/2023)

## 2023-09-03 LAB — CULTURE, BLOOD (ROUTINE X 2)
Special Requests: ADEQUATE
Special Requests: ADEQUATE

## 2023-09-03 NOTE — Anesthesia Postprocedure Evaluation (Signed)
 Anesthesia Post Note  Patient: Calvin Fernandez  Procedure(s) Performed: XI ROBOTIC ASSISTED LAPAROSCOPIC CHOLECYSTECTOMY (Abdomen)  Patient location during evaluation: Phase II Anesthesia Type: General Level of consciousness: awake Pain management: pain level controlled Vital Signs Assessment: post-procedure vital signs reviewed and stable Respiratory status: spontaneous breathing and respiratory function stable Cardiovascular status: blood pressure returned to baseline and stable Postop Assessment: no headache and no apparent nausea or vomiting Anesthetic complications: no Comments: Late entry   No notable events documented.   Last Vitals:  Vitals:   08/31/23 2111 09/01/23 0336  BP: 128/80 130/80  Pulse: 65 (!) 58  Resp: 19 16  Temp: 36.8 C 36.6 C  SpO2: 95% 94%    Last Pain:  Vitals:   09/01/23 0750  TempSrc:   PainSc: 0-No pain                 Windell Norfolk

## 2023-09-09 DIAGNOSIS — K810 Acute cholecystitis: Secondary | ICD-10-CM | POA: Diagnosis not present

## 2023-09-09 DIAGNOSIS — I482 Chronic atrial fibrillation, unspecified: Secondary | ICD-10-CM | POA: Diagnosis not present

## 2023-09-09 DIAGNOSIS — R748 Abnormal levels of other serum enzymes: Secondary | ICD-10-CM | POA: Diagnosis not present

## 2023-09-14 ENCOUNTER — Encounter: Payer: Self-pay | Admitting: Surgery

## 2023-09-14 ENCOUNTER — Ambulatory Visit (INDEPENDENT_AMBULATORY_CARE_PROVIDER_SITE_OTHER): Payer: Medicare PPO | Admitting: Surgery

## 2023-09-14 VITALS — BP 132/85 | HR 67 | Temp 98.3°F | Resp 14 | Ht 74.0 in | Wt 207.0 lb

## 2023-09-14 DIAGNOSIS — Z09 Encounter for follow-up examination after completed treatment for conditions other than malignant neoplasm: Secondary | ICD-10-CM

## 2023-09-14 NOTE — Progress Notes (Signed)
 [facility] Note   HPI:  68 y.o. Male presents to clinic for post-op follow-up status post robotic assisted laparoscopic cholecystectomy on 2/17.  The patient has been doing well since surgery.  He is tolerating a diet without nausea and vomiting, and moving his bowels without issue.  He is only taking Tylenol scheduled.  He is not taking anything else for pain.  Denies fevers and chills.  Review of Systems:  All other review of systems: otherwise negative   Vital Signs:  BP 132/85   Pulse 67   Temp 98.3 F (36.8 C) (Oral)   Resp 14   Ht 6\' 2"  (1.88 m)   Wt 207 lb (93.9 kg)   SpO2 93%   BMI 26.58 kg/m    Physical Exam:  Physical Exam Vitals reviewed.  Constitutional:      Appearance: Normal appearance.  Abdominal:     Comments: Abdomen soft, nondistended, no percussion tenderness, nontender to palpation; no rigidity, guarding, rebound tenderness; laparoscopic incision sites healing well with skin glue still in place  Neurological:     Mental Status: He is alert.     Laboratory studies: None   Imaging:  None  Pathology: A. GALLBLADDER, CHOLECYSTECTOMY:       Acute and chronic cholecystitis.       Cholelithiasis.    Assessment:  68 y.o. yo Male who presents for follow up status post robotic assisted laparoscopic cholecystectomy on 2/17.  Plan:  -Patient overall has been doing well since surgery.  He is tolerating a diet, moving his bowels, and pain is controlled  -Advised that he can now take the Tylenol only as needed for pain -Also advised that he can use antibiotic ointment at his skin incisions prior to showering to help loosen the remaining skin glue -Follow up with me as needed  All of the above recommendations were discussed with the patient, and all of patient's questions were answered to his expressed satisfaction.  Note: Portions of this report may have been transcribed using voice recognition software. Every effort has been made to  ensure accuracy; however, inadvertent computerized transcription errors may still be present.   Theophilus Kinds, DO [facility] [address] Vella Raring [place], [place] [address] [phone] (office)

## 2023-09-16 ENCOUNTER — Encounter: Payer: Medicare PPO | Admitting: Surgery

## 2023-09-22 DIAGNOSIS — L538 Other specified erythematous conditions: Secondary | ICD-10-CM | POA: Diagnosis not present

## 2023-09-22 DIAGNOSIS — L820 Inflamed seborrheic keratosis: Secondary | ICD-10-CM | POA: Diagnosis not present

## 2023-09-22 DIAGNOSIS — L501 Idiopathic urticaria: Secondary | ICD-10-CM | POA: Diagnosis not present

## 2023-09-22 DIAGNOSIS — L2989 Other pruritus: Secondary | ICD-10-CM | POA: Diagnosis not present

## 2023-09-22 DIAGNOSIS — R208 Other disturbances of skin sensation: Secondary | ICD-10-CM | POA: Diagnosis not present

## 2023-10-28 ENCOUNTER — Encounter (HOSPITAL_COMMUNITY): Payer: Self-pay | Admitting: Physician Assistant

## 2023-10-28 ENCOUNTER — Ambulatory Visit (HOSPITAL_COMMUNITY)
Admission: RE | Admit: 2023-10-28 | Discharge: 2023-10-28 | Disposition: A | Discharge status: Home / Self Care | Payer: Medicare PPO | Source: Ambulatory Visit | Attending: Physician Assistant | Admitting: Physician Assistant

## 2023-10-28 VITALS — BP 126/90 | HR 77 | Ht 74.0 in | Wt 211.6 lb

## 2023-10-28 DIAGNOSIS — Z79899 Other long term (current) drug therapy: Secondary | ICD-10-CM | POA: Insufficient documentation

## 2023-10-28 DIAGNOSIS — Z7901 Long term (current) use of anticoagulants: Secondary | ICD-10-CM | POA: Diagnosis not present

## 2023-10-28 DIAGNOSIS — I10 Essential (primary) hypertension: Secondary | ICD-10-CM | POA: Diagnosis not present

## 2023-10-28 DIAGNOSIS — I480 Paroxysmal atrial fibrillation: Secondary | ICD-10-CM | POA: Diagnosis not present

## 2023-10-28 DIAGNOSIS — D6869 Other thrombophilia: Secondary | ICD-10-CM | POA: Insufficient documentation

## 2023-10-28 DIAGNOSIS — I350 Nonrheumatic aortic (valve) stenosis: Secondary | ICD-10-CM | POA: Diagnosis not present

## 2023-10-28 MED ORDER — HYDROCHLOROTHIAZIDE 12.5 MG PO TABS: 12.5000 mg | ORAL_TABLET | Freq: Two times a day (BID) | ORAL | 3 refills | Status: DC

## 2023-10-28 MED ORDER — DILTIAZEM HCL ER COATED BEADS 180 MG PO CP24: 180.0000 mg | ORAL_CAPSULE | Freq: Every day | ORAL | 3 refills | Status: DC

## 2023-10-28 MED ORDER — APIXABAN 5 MG PO TABS: 5.0000 mg | ORAL_TABLET | Freq: Two times a day (BID) | ORAL | 3 refills | Status: DC

## 2023-10-28 NOTE — Progress Notes (Signed)
 Primary Care Physician: Omie Bickers, MD Primary Cardiologist: Armida Lander, MD Electrophysiologist: None  Referring Physician: ED   Calvin Fernandez is a 68 y.o. male with a history of HTN and atrial fibrillation who presents for follow up in the [facility].  The patient was initially diagnosed with atrial fibrillation 06/18/23 after presenting to the ED with symptoms of chest pain. ECG showed afib with elevated rates. Patient underwent DCCV at that time. Patient was started on Eliquis for stroke prevention. Started on diltiazem at 06/23/23 visit.   Patient returns for follow up for atrial fibrillation. He reports that he has done well from a cardiac standpoint. He had only one episode of palpitations lasting < 1 minute. Of note, he was admitted 08/2023 for acute cholecystitis and is s/p lap cholecystectomy. No bleeding issues on anticoagulation.   Today, he  denies symptoms of chest pain, shortness of breath, orthopnea, PND, lower extremity edema, dizziness, presyncope, syncope, snoring, daytime somnolence, bleeding, or neurologic sequela. The patient is tolerating medications without difficulties and is otherwise without complaint today.    Atrial Fibrillation Risk Factors:  he does not have symptoms or diagnosis of sleep apnea. he does not have a history of rheumatic fever. he does not have a history of alcohol use. The patient does not have a history of early familial atrial fibrillation or other arrhythmias.  Atrial Fibrillation Management history:  Previous antiarrhythmic drugs: none Previous cardioversions: 06/18/23 Previous ablations: none Anticoagulation history: Eliquis  ROS- All systems are reviewed and negative except as per the HPI above.  Past Medical History:  Diagnosis Date   A-fib [facility])    Chest pain 07/13/2009   mild aortic plaque on CT of abdomen; negative stress echocardiogram in 09/2011   GERD (gastroesophageal reflux disease)     Hypertension    multiple drug intolerances; noncompliance   Wears glasses     Current Outpatient Medications  Medication Sig Dispense Refill   cetirizine (ZYRTEC) 10 MG tablet Take 10 mg by mouth daily.     docusate sodium (COLACE) 100 MG capsule Take 1 capsule (100 mg total) by mouth 2 (two) times daily. 60 capsule 2   ondansetron (ZOFRAN-ODT) 4 MG disintegrating tablet 4mg  ODT q4 hours prn nausea/vomit (Patient taking differently: Take 4 mg by mouth every 8 (eight) hours as needed for nausea or vomiting. 4mg  ODT q4 hours prn nausea/vomit) 10 tablet 0   oxyCODONE (ROXICODONE) 5 MG immediate release tablet Take 1 tablet (5 mg total) by mouth every 6 (six) hours as needed. 5 tablet 0   pantoprazole (PROTONIX) 40 MG tablet Take 1 tablet (40 mg total) by mouth 2 (two) times daily before a meal. 30 tablet 0   potassium chloride (KLOR-CON) 10 MEQ tablet Take 10 mEq by mouth once.     apixaban (ELIQUIS) 5 MG TABS tablet Take 1 tablet (5 mg total) by mouth 2 (two) times daily. Restart 09/02/23 180 tablet 3   diltiazem (CARDIZEM CD) 180 MG 24 hr capsule Take 1 capsule (180 mg total) by mouth daily. 90 capsule 3   hydrochlorothiazide (HYDRODIURIL) 12.5 MG tablet Take 1 tablet (12.5 mg total) by mouth 2 (two) times daily. 180 tablet 3   No current facility-administered medications for this encounter.    Physical Exam: BP (!) 126/90   Pulse 77   Ht 6\' 2"  (1.88 m)   Wt 96 kg   BMI 27.17 kg/m   GEN: Well nourished, well developed in no acute  distress CARDIAC: Regular rate and rhythm, no murmurs, rubs, gallops RESPIRATORY:  Clear to auscultation without rales, wheezing or rhonchi  ABDOMEN: Soft, non-tender, non-distended EXTREMITIES:  No edema; No deformity    Wt Readings from Last 3 Encounters:  10/28/23 96 kg  09/14/23 93.9 kg  08/30/23 95.3 kg     EKG today demonstrates  SR, PVC Vent. rate 77 BPM PR interval 150 ms QRS duration 94 ms QT/QTcB 402/454 ms   Echo 07/05/23 1.  Left ventricular ejection fraction, by estimation, is 55 to 60%. The  left ventricle has normal function. The left ventricle has no regional  wall motion abnormalities. Left ventricular diastolic parameters are  consistent with Grade I diastolic dysfunction (impaired relaxation).   2. Right ventricular systolic function is normal. The right ventricular  size is normal. Tricuspid regurgitation signal is inadequate for assessing  PA pressure.   3. Left atrial size was mildly dilated.   4. The mitral valve is grossly normal. Trivial mitral valve  regurgitation.   5. The aortic valve is tricuspid. There is mild calcification of the  aortic valve. Aortic valve regurgitation is not visualized. Aortic valve  sclerosis/calcification is present, without any evidence of aortic  stenosis.   6. The inferior vena cava is normal in size with greater than 50%  respiratory variability, suggesting right atrial pressure of 3 mmHg.   Comparison(s): Prior images unable to be directly viewed, comparison made  by report only.    CHA2DS2-VASc Score = 2  The patient's score is based upon: CHF History: 0 HTN History: 1 Diabetes History: 0 Stroke History: 0 Vascular Disease History: 0 Age Score: 1 Gender Score: 0       ASSESSMENT AND PLAN: Paroxysmal Atrial Fibrillation (ICD10:  I48.0) The patient's CHA2DS2-VASc score is 2, indicating a 2.2% annual risk of stroke.   Patient appears to be maintaining SR Continue diltiazem 180 mg daily Continue Eliquis 5 mg BID Apple Watch for home monitoring.   Secondary Hypercoagulable State (ICD10:  D68.69) The patient is at significant risk for stroke/thromboembolism based upon his CHA2DS2-VASc Score of 2.  Continue Apixaban (Eliquis). No bleeding issues.   HTN Stable on current regimen   Follow up with Dr Amanda Jungling per recall. AF clinic in one year.       Myrtha Ates PA-C [facility] [address] [place], [place]  [address] [phone]

## 2023-12-17 DIAGNOSIS — Z6830 Body mass index (BMI) 30.0-30.9, adult: Secondary | ICD-10-CM | POA: Diagnosis not present

## 2023-12-17 DIAGNOSIS — I10 Essential (primary) hypertension: Secondary | ICD-10-CM | POA: Diagnosis not present

## 2023-12-23 DIAGNOSIS — K810 Acute cholecystitis: Secondary | ICD-10-CM | POA: Diagnosis not present

## 2023-12-23 DIAGNOSIS — D6869 Other thrombophilia: Secondary | ICD-10-CM | POA: Diagnosis not present

## 2023-12-23 DIAGNOSIS — R748 Abnormal levels of other serum enzymes: Secondary | ICD-10-CM | POA: Diagnosis not present

## 2023-12-23 DIAGNOSIS — I482 Chronic atrial fibrillation, unspecified: Secondary | ICD-10-CM | POA: Diagnosis not present

## 2023-12-23 DIAGNOSIS — E669 Obesity, unspecified: Secondary | ICD-10-CM | POA: Diagnosis not present

## 2023-12-23 DIAGNOSIS — Z6830 Body mass index (BMI) 30.0-30.9, adult: Secondary | ICD-10-CM | POA: Diagnosis not present

## 2023-12-23 DIAGNOSIS — R972 Elevated prostate specific antigen [PSA]: Secondary | ICD-10-CM | POA: Diagnosis not present

## 2023-12-23 DIAGNOSIS — I10 Essential (primary) hypertension: Secondary | ICD-10-CM | POA: Diagnosis not present

## 2023-12-23 DIAGNOSIS — R7989 Other specified abnormal findings of blood chemistry: Secondary | ICD-10-CM | POA: Diagnosis not present

## 2024-04-17 DIAGNOSIS — K649 Unspecified hemorrhoids: Secondary | ICD-10-CM | POA: Diagnosis not present

## 2024-04-27 ENCOUNTER — Encounter (INDEPENDENT_AMBULATORY_CARE_PROVIDER_SITE_OTHER): Payer: Self-pay | Admitting: *Deleted

## 2024-05-08 ENCOUNTER — Encounter (INDEPENDENT_AMBULATORY_CARE_PROVIDER_SITE_OTHER): Payer: Self-pay | Admitting: Gastroenterology

## 2024-05-08 ENCOUNTER — Ambulatory Visit (INDEPENDENT_AMBULATORY_CARE_PROVIDER_SITE_OTHER): Admitting: Gastroenterology

## 2024-05-08 ENCOUNTER — Telehealth (INDEPENDENT_AMBULATORY_CARE_PROVIDER_SITE_OTHER): Payer: Self-pay

## 2024-05-08 VITALS — BP 127/80 | HR 76 | Temp 97.1°F | Ht 74.0 in | Wt 215.4 lb

## 2024-05-08 DIAGNOSIS — K649 Unspecified hemorrhoids: Secondary | ICD-10-CM

## 2024-05-08 DIAGNOSIS — K625 Hemorrhage of anus and rectum: Secondary | ICD-10-CM | POA: Diagnosis not present

## 2024-05-08 NOTE — Progress Notes (Addendum)
 Referring Provider: Shona Norleen PEDLAR, MD Primary Care Physician:  Shona Norleen PEDLAR, MD Primary GI Physician: new   Chief Complaint  Patient presents with   Hemorrhoids    Pt arrives to discuss hemorrhoids. Pt states he has had hemorrhoids for a while but they have worsened. Has tried different treatments with no relief. Pt did start a blood thinner last year. Hemorrhoids do bleed at times. Gallbladder surgery at first of year so that has changed bowel habits.    HPI:   Calvin Fernandez is a 68 y.o. male with past medical history of A fib on Eliquis , GERD, HTN  Patient presenting today as a new patient for: Rectal bleeding Hemorrhoids   Hgb on 10/6 was 14.9  States he has been dealing with hemorrhoids for a while. He notes this got worse after his GB removal earlier this year. He had some constipation, but improved with miralax . He notes sensation of a bulge in rectal area and some bleeding. He has done otc topicals, ice, sitz baths and some meds that his doctor gave him. States these things helped for a while and then he would notice a flare of them shortly after.  He states that about 2 months ago, he noted heavier bleeding with blood sometimes dripping into the toilet. No red flag symptoms. Patient denies melena, nausea, vomiting, diarrhea, dysphagia, odyonophagia, early satiety or weight loss.   NSAID use: as needed  Social hx: no etoh or tobacco  Fam hx:no CRC, liver disease, pancreatic cancer    Last Colonoscopy: never, had cologuard 3-4 years ago  Last Endoscopy: never    [employer]   05/08/24 0812  Weight: 215 lb 6.4 oz (97.7 kg)     Past Medical History:  Diagnosis Date   A-fib (HCC)    Chest pain 07/13/2009   mild aortic plaque on CT of abdomen; negative stress echocardiogram in 09/2011   GERD (gastroesophageal reflux disease)    Hypertension    multiple drug intolerances; noncompliance   Wears glasses     Past Surgical History:  Procedure Laterality Date    CHOLECYSTECTOMY     EXTENSOR TENDON OF FOREARM / WRIST REPAIR  07/13/1988   left   HIP PINNING,CANNULATED Right 11/12/2014   Procedure: PERCUTANEOUS PINNING RIGHT HIP ;  Surgeon: Dempsey Sensor, MD;  Location: [facility];  Service: Orthopedics;  Laterality: Right;    Current Outpatient Medications  Medication Sig Dispense Refill   apixaban  (ELIQUIS ) 5 MG TABS tablet Take 1 tablet (5 mg total) by mouth 2 (two) times daily. Restart 09/02/23 180 tablet 3   diltiazem  (CARDIZEM  CD) 180 MG 24 hr capsule Take 1 capsule (180 mg total) by mouth daily. 90 capsule 3   hydrochlorothiazide  (HYDRODIURIL ) 12.5 MG tablet Take 1 tablet (12.5 mg total) by mouth 2 (two) times daily. 180 tablet 3   No current facility-administered medications for this visit.    Allergies as of 05/08/2024 - Review Complete 05/08/2024  Allergen Reaction Noted   Tetanus toxoid Rash 04/07/2010    Social History   Socioeconomic History   Marital status: Married    Spouse name: Not on file   Number of children: Not on file   Years of education: Not on file   Highest education level: Not on file  Occupational History   Occupation: Engineer, Site  Tobacco Use   Smoking status: Never   Smokeless tobacco: Never   Tobacco comments:    Never smoked 07/29/23  Vaping Use  Vaping status: Never Used  Substance and Sexual Activity   Alcohol use: No   Drug use: No   Sexual activity: Not on file  Other Topics Concern   Not on file  Social History Narrative   No regular exercise   Social Drivers of Health   Financial Resource Strain: Not on file  Food Insecurity: No Food Insecurity (08/28/2023)   Hunger Vital Sign    Worried About Running Out of Food in the Last Year: Never true    Ran Out of Food in the Last Year: Never true  Transportation Needs: No Transportation Needs (08/28/2023)   PRAPARE - Administrator, Civil Service (Medical): No    Lack of Transportation (Non-Medical): No  Physical  Activity: Not on file  Stress: Not on file  Social Connections: Moderately Integrated (08/28/2023)   Social Connection and Isolation Panel    Frequency of Communication with Friends and Family: More than three times a week    Frequency of Social Gatherings with Friends and Family: More than three times a week    Attends Religious Services: More than 4 times per year    Active Member of [employer] or Organizations: No    Attends Engineer, Structural: Never    Marital Status: Married    Review of systems General: negative for malaise, night sweats, fever, chills, weight loss Neck: Negative for lumps, goiter, pain and significant neck swelling Resp: Negative for cough, wheezing, dyspnea at rest CV: Negative for chest pain, leg swelling, palpitations, orthopnea GI: denies melena, nausea, vomiting, diarrhea, constipation, dysphagia, odyonophagia, early satiety or unintentional weight loss. +rectal bleeding +hemorrhoids  MSK: Negative for joint pain or swelling, back pain, and muscle pain. Derm: Negative for itching or rash Psych: Denies depression, anxiety, memory loss, confusion. No homicidal or suicidal ideation.  Heme: Negative for prolonged bleeding, bruising easily, and swollen nodes. Endocrine: Negative for cold or heat intolerance, polyuria, polydipsia and goiter. Neuro: negative for tremor, gait imbalance, syncope and seizures. The remainder of the review of systems is noncontributory.  Physical Exam: BP (!) 146/95   Pulse 76   Temp (!) 97.1 F (36.2 C)   Ht 6' 2 (1.88 m)   Wt 215 lb 6.4 oz (97.7 kg)   BMI 27.66 kg/m  General:   Alert and oriented. No distress noted. Pleasant and cooperative.  Head:  Normocephalic and atraumatic. Eyes:  Conjuctiva clear without scleral icterus. Mouth:  Oral mucosa pink and moist. Good dentition. No lesions. Heart: Normal rate and rhythm, s1 and s2 heart sounds present.  Lungs: Clear lung sounds in all lobes. Respirations equal and  unlabored. Abdomen:  +BS, soft, non-tender and non-distended. No rebound or guarding. No HSM or masses noted. Rectal: crystal sutton CMA present as witness, no obvious lesions, masses or fissures noted externally, DRE tolerated without issue, sphincter tone WNL, no obvious masses felt upon examination of proximal rectal vault  Derm: No palmar erythema or jaundice Msk:  Symmetrical without gross deformities. Normal posture. Extremities:  Without edema. Neurologic:  Alert and  oriented x4 Psych:  Alert and cooperative. Normal mood and affect.  Invalid input(s): 6 MONTHS   ASSESSMENT: EDVIN ALBUS is a 68 y.o. male presenting today as a new patient for rectal bleeding, hemorrhoids  Patient with no prior colonoscopy presenting with several months of sensation of rectal bulge and rectal bleeding with blood dripping into the toilet at times. Constipation well managed with miralax , only temporary improvement with topica hemorrhoid creams.  He is on Eliquis . No obvious hemorrhoids on exam, at this time would recommend colonoscopy as he has never had one and would want to rule out any other sources of bleeding such as polyps, malignancy, AVMs. For now can continue miralax , topical hemorrhoid cream given by his PCP as he did not wish to try CA compound hemorrhoid cream due to OOP expense. We did discuss hemorrhoid banding, he is not an ideal candidate given he is on eliquis  though this is not a complete contraindication. Will proceed with Colonoscopy and discuss banding further with Dr. Eartha thereafter. Indications, risks and benefits of procedure discussed in detail with patient. Patient verbalized understanding and is in agreement to proceed with colonoscopy.     PLAN:  -schedule colonoscopy ASA III, hold eliquis  per protocol, cards clearance  -continue to use the hemorrhoid cream your PCP provided -pt to make me aware if he wishes to try CA compound hemorrhoid cream -Continue miralax  and  good water  intake -avoid straining, limit sitting on the toilet for long periods of time -consider hemorrhoid banding after colonoscopy, will need to discuss further with DR. Castaneda given patient is on ACs  All questions were answered, patient verbalized understanding and is in agreement with plan as outlined above.    Follow Up: 2 months   Maryan Sivak L. Mariette, MSN, APRN, AGNP-C Adult-Gerontology Nurse Practitioner [facility]  I have reviewed the note and agree with the APP's assessment as described in this progress note  Toribio Eartha, MD Gastroenterology and Hepatology [facility]

## 2024-05-08 NOTE — Telephone Encounter (Signed)
    05/08/24  Calvin Fernandez 08/11/1955  What type of surgery is being performed? Colonoscopy   When is surgery scheduled? TBD  What type of clearance is required (medical or pharmacy to hold medication or both? medication  Are there any medications that need to be held prior to surgery and how long? Eliquis , hold 2 days prior to procedure.  Name of physician performing surgery?  Dr. Eartha Rouse [facility]enter[facility] at [facility] Phone: [phone] Fax: [phone]  Anethesia type (none, local, MAC, general)? MAC

## 2024-05-08 NOTE — Patient Instructions (Addendum)
 We will get you scheduled for colonoscopy You can continue to use the hemorrhoid cream your PCP provided, if you would like to try the [employer] compound, let me know  Continue miralax  and good water  intake, avoid straining, limit sitting on the toilet for long periods of time  Follow up 2 months  It was a pleasure to see you today. I want to create trusting relationships with patients and provide genuine, compassionate, and quality care. I truly value your feedback! please be on the lookout for a survey regarding your visit with me today. I appreciate your input about our visit and your time in completing this!    Teona Vargus L. Cleone Hulick, MSN, APRN, AGNP-C Adult-Gerontology Nurse Practitioner [facility] Gastroenterology at [facility]

## 2024-05-14 NOTE — Telephone Encounter (Signed)
 Patient with diagnosis of atrial fibrillation on Eliquis  for anticoagulation.    What type of surgery is being performed? Colonoscopy    When is surgery scheduled? TBD   CHA2DS2-VASc Score = 2   This indicates a 2.2% annual risk of stroke. The patient's score is based upon: CHF History: 0 HTN History: 1 Diabetes History: 0 Stroke History: 0 Vascular Disease History: 0 Age Score: 1 Gender Score: 0    CrCl 90 Platelet count 261  Patient has not had an Afib/aflutter ablation in the last 3 months, DCCV within the last 4 weeks or a watchman implanted in the last 45 days   Per office protocol, patient can hold Eliquis  for 2 days prior to procedure.   Patient will not need bridging with Lovenox (enoxaparin) around procedure.  **This guidance is not considered finalized until pre-operative APP has relayed final recommendations.**

## 2024-05-15 NOTE — Telephone Encounter (Signed)
   Patient Name: Calvin Fernandez  DOB: 06-18-1956 MRN: [id number]  Primary Cardiologist: Alvan Carrier, MD  Clinical pharmacists have reviewed the patient's past medical history, labs, and current medications as part of preoperative protocol coverage. The following recommendations have been made:  Per office protocol, patient can hold Eliquis  for 2 days prior to procedure.   Patient will not need bridging with Lovenox (enoxaparin) around procedure.   I will route this recommendation to the requesting party via Epic fax function and remove from pre-op pool.  Please call with questions.  Lum LITTIE Louis, NP 05/15/2024, 8:53 AM

## 2024-05-16 ENCOUNTER — Ambulatory Visit: Attending: Cardiology | Admitting: Cardiology

## 2024-05-16 ENCOUNTER — Encounter: Payer: Self-pay | Admitting: Cardiology

## 2024-05-16 VITALS — BP 142/88 | HR 76 | Ht 74.0 in | Wt 213.0 lb

## 2024-05-16 DIAGNOSIS — I10 Essential (primary) hypertension: Secondary | ICD-10-CM

## 2024-05-16 DIAGNOSIS — D6869 Other thrombophilia: Secondary | ICD-10-CM

## 2024-05-16 DIAGNOSIS — I480 Paroxysmal atrial fibrillation: Secondary | ICD-10-CM

## 2024-05-16 MED ORDER — PEG 3350-KCL-NA BICARB-NACL 420 G PO SOLR: 4000.0000 mL | Freq: Once | ORAL | 0 refills | Status: AC

## 2024-05-16 NOTE — Patient Instructions (Signed)

## 2024-05-16 NOTE — Addendum Note (Signed)
 Addended by: JEANELL GRAEME RAMAN on: 05/16/2024 04:11 PM   Modules accepted: Orders

## 2024-05-16 NOTE — Progress Notes (Signed)
 Clinical Summary [name] is a 68 y.o.male seen today for follow up of the following medical problems.      1. HTN - prevoiusly stopped metoprolol  and norvasc , thought was causing a rash.    - home bp's typically 130s/80s - compliant with meds    2. PAF - long history of palpitations - more recent diagnosis of PAF 06/18/23, presented to ER with chest pain. Had DCCV in ER - followed in afib clinic - compliant with diltiazem . Some palpitations last night, lasted about 1 hour. Only episode.  - no bleeding on eliquis .       Not interested in statin ASCVD 20%    SH His mother in law Calvin Fernandez is also a patient at our practice His wife Calvin Fernandez also is a patient.  Past Medical History:  Diagnosis Date   A-fib [facility])    Chest pain 07/13/2009   mild aortic plaque on CT of abdomen; negative stress echocardiogram in 09/2011   GERD (gastroesophageal reflux disease)    Hypertension    multiple drug intolerances; noncompliance   Wears glasses      Allergies  Allergen Reactions   Tetanus Toxoid Rash     Current Outpatient Medications  Medication Sig Dispense Refill   apixaban  (ELIQUIS ) 5 MG TABS tablet Take 1 tablet (5 mg total) by mouth 2 (two) times daily. Restart 09/02/23 180 tablet 3   diltiazem  (CARDIZEM  CD) 180 MG 24 hr capsule Take 1 capsule (180 mg total) by mouth daily. 90 capsule 3   hydrochlorothiazide  (HYDRODIURIL ) 12.5 MG tablet Take 1 tablet (12.5 mg total) by mouth 2 (two) times daily. 180 tablet 3   No current facility-administered medications for this visit.     Past Surgical History:  Procedure Laterality Date   CHOLECYSTECTOMY     EXTENSOR TENDON OF FOREARM / WRIST REPAIR  07/13/1988   left   HIP PINNING,CANNULATED Right 11/12/2014   Procedure: PERCUTANEOUS PINNING RIGHT HIP ;  Surgeon: Dempsey Sensor, MD;  Location: [facility];  Service: Orthopedics;  Laterality: Right;     Allergies  Allergen Reactions   Tetanus  Toxoid Rash      Family History  Problem Relation Age of Onset   Heart attack Father 62     Social History Mr. Kupper reports that he has never smoked. He has never used smokeless tobacco. Mr. Stapel reports no history of alcohol use.    Physical Examination Vitals:   05/16/24 1134 05/16/24 1216  BP: (!) 158/94 (!) 142/88  Pulse: 76   SpO2: 98%    Filed Weights   05/16/24 1134  Weight: 213 lb (96.6 kg)    Gen: resting comfortably, no acute distress HEENT: no scleral icterus, pupils equal round and reactive, no palptable cervical adenopathy,  CV: RRR, no mrg, no jvd Resp: Clear to auscultation bilaterally GI: abdomen is soft, non-tender, non-distended, normal bowel sounds, no hepatosplenomegaly MSK: extremities are warm, no edema.  Skin: warm, no rash Neuro:  no focal deficits Psych: appropriate affect   Diagnostic Studies  06/2023 echo 1. Left ventricular ejection fraction, by estimation, is 55 to 60%. The  left ventricle has normal function. The left ventricle has no regional  wall motion abnormalities. Left ventricular diastolic parameters are  consistent with Grade I diastolic  dysfunction (impaired relaxation).   2. Right ventricular systolic function is normal. The right ventricular  size is normal. Tricuspid regurgitation signal is inadequate for assessing  PA pressure.  3. Left atrial size was mildly dilated.   4. The mitral valve is grossly normal. Trivial mitral valve  regurgitation.   5. The aortic valve is tricuspid. There is mild calcification of the  aortic valve. Aortic valve regurgitation is not visualized. Aortic valve  sclerosis/calcification is present, without any evidence of aortic  stenosis.   6. The inferior vena cava is normal in size with greater than 50%  respiratory variability, suggesting right atrial pressure of 3 mmHg.    Assessment and Plan  1. HTN - mildly elevated in clinic, home numbers at goal - continue current  meds   2. PAF/acquired thrombophilia - isoalted short episode of palpitations, continue current meds - continue eliquis  for stroke prevention      Dorn PHEBE Ross, M.D.

## 2024-05-16 NOTE — Telephone Encounter (Addendum)
 Called pt and he has been scheduled for 12/2 for colonoscopy with Dr. Eartha, ASA 3. He is aware to hold eliquis  x 2 days prior. Will mail instructions and rx for prep to be sent to pharmacy.  Per cohere for PA Information about your requested care Prior authorization is not required for this code

## 2024-06-07 ENCOUNTER — Encounter (HOSPITAL_COMMUNITY)
Admission: RE | Admit: 2024-06-07 | Discharge: 2024-06-07 | Disposition: A | Discharge status: Home / Self Care | Source: Ambulatory Visit | Attending: Gastroenterology | Admitting: Gastroenterology

## 2024-06-07 ENCOUNTER — Encounter (HOSPITAL_COMMUNITY): Payer: Self-pay

## 2024-06-13 ENCOUNTER — Encounter (HOSPITAL_COMMUNITY)
Admission: RE | Disposition: A | Discharge status: Home / Self Care | Payer: Self-pay | Source: Home / Self Care | Attending: Gastroenterology

## 2024-06-13 ENCOUNTER — Ambulatory Visit (HOSPITAL_COMMUNITY)
Admission: RE | Admit: 2024-06-13 | Discharge: 2024-06-13 | Disposition: A | Discharge status: Home / Self Care | Attending: Gastroenterology | Admitting: Gastroenterology

## 2024-06-13 ENCOUNTER — Encounter (HOSPITAL_COMMUNITY): Payer: Self-pay | Admitting: Gastroenterology

## 2024-06-13 ENCOUNTER — Other Ambulatory Visit: Payer: Self-pay

## 2024-06-13 ENCOUNTER — Ambulatory Visit (HOSPITAL_COMMUNITY): Admitting: Anesthesiology

## 2024-06-13 DIAGNOSIS — D124 Benign neoplasm of descending colon: Secondary | ICD-10-CM | POA: Diagnosis not present

## 2024-06-13 DIAGNOSIS — K648 Other hemorrhoids: Secondary | ICD-10-CM

## 2024-06-13 DIAGNOSIS — I10 Essential (primary) hypertension: Secondary | ICD-10-CM

## 2024-06-13 DIAGNOSIS — D122 Benign neoplasm of ascending colon: Secondary | ICD-10-CM

## 2024-06-13 DIAGNOSIS — D120 Benign neoplasm of cecum: Secondary | ICD-10-CM | POA: Diagnosis not present

## 2024-06-13 DIAGNOSIS — K5730 Diverticulosis of large intestine without perforation or abscess without bleeding: Secondary | ICD-10-CM

## 2024-06-13 DIAGNOSIS — K635 Polyp of colon: Secondary | ICD-10-CM | POA: Diagnosis not present

## 2024-06-13 DIAGNOSIS — K644 Residual hemorrhoidal skin tags: Secondary | ICD-10-CM

## 2024-06-13 DIAGNOSIS — K625 Hemorrhage of anus and rectum: Secondary | ICD-10-CM

## 2024-06-13 HISTORY — PX: COLONOSCOPY: SHX5424

## 2024-06-13 HISTORY — PX: POLYPECTOMY: SHX149

## 2024-06-13 LAB — HM COLONOSCOPY

## 2024-06-13 SURGERY — COLONOSCOPY
Anesthesia: General

## 2024-06-13 MED ORDER — LACTATED RINGERS IV SOLN: INTRAVENOUS | Status: DC | PRN

## 2024-06-13 MED ORDER — PROPOFOL 500 MG/50ML IV EMUL
INTRAVENOUS | Status: DC | PRN
  Administered 2024-06-13: 150 ug/kg/min via INTRAVENOUS

## 2024-06-13 MED ORDER — LACTATED RINGERS IV SOLN: INTRAVENOUS | Status: DC

## 2024-06-13 MED ORDER — PROPOFOL 10 MG/ML IV BOLUS
INTRAVENOUS | Status: DC | PRN
  Administered 2024-06-13: 100 mg via INTRAVENOUS
  Administered 2024-06-13 (×2): 50 mg via INTRAVENOUS

## 2024-06-13 MED ORDER — LIDOCAINE 2% (20 MG/ML) 5 ML SYRINGE
INTRAMUSCULAR | Status: DC | PRN
  Administered 2024-06-13: 50 mg via INTRAVENOUS

## 2024-06-13 NOTE — Anesthesia Preprocedure Evaluation (Signed)
 Anesthesia Evaluation  Patient identified by MRN, date of birth, ID band Patient awake    Reviewed: Allergy & Precautions, H&P , NPO status , Patient's Chart, lab work & pertinent test results  Airway Mallampati: II  TM Distance: >3 FB Neck ROM: Full    Dental no notable dental hx.    Pulmonary neg pulmonary ROS   Pulmonary exam normal breath sounds clear to auscultation       Cardiovascular hypertension, Normal cardiovascular exam+ dysrhythmias Atrial Fibrillation  Rhythm:Regular Rate:Normal  Paroxysmal afib   Neuro/Psych negative neurological ROS  negative psych ROS   GI/Hepatic Neg liver ROS,GERD  ,,  Endo/Other  negative endocrine ROS    Renal/GU negative Renal ROS  negative genitourinary   Musculoskeletal negative musculoskeletal ROS (+)    Abdominal   Peds negative pediatric ROS (+)  Hematology negative hematology ROS (+)   Anesthesia Other Findings   Reproductive/Obstetrics negative OB ROS                              Anesthesia Physical Anesthesia Plan  ASA: 3  Anesthesia Plan: General   Post-op Pain Management:    Induction: Intravenous  PONV Risk Score and Plan:   Airway Management Planned: Nasal Cannula  Additional Equipment:   Intra-op Plan:   Post-operative Plan:   Informed Consent: I have reviewed the patients History and Physical, chart, labs and discussed the procedure including the risks, benefits and alternatives for the proposed anesthesia with the patient or authorized representative who has indicated his/her understanding and acceptance.     Dental advisory given  Plan Discussed with: CRNA  Anesthesia Plan Comments:         Anesthesia Quick Evaluation

## 2024-06-13 NOTE — H&P (Signed)
 Calvin Fernandez is an 68 y.o. male.   Chief Complaint: rectal bleeding HPI: Calvin Fernandez is a 68 y.o. male with past medical history of A fib on Eliquis , GERD, HTN coming for evaluation of rectal bleeding.  Patient has presented recurrent rectal bleeding. The patient denies having any nausea, vomiting, fever, chills, hematochezia, melena, hematemesis, abdominal distention, abdominal pain, diarrhea, jaundice, pruritus or weight loss.   Past Medical History:  Diagnosis Date   A-fib (HCC)    Chest pain 07/13/2009   mild aortic plaque on CT of abdomen; negative stress echocardiogram in 09/2011   GERD (gastroesophageal reflux disease)    Hypertension    multiple drug intolerances; noncompliance   Wears glasses     Past Surgical History:  Procedure Laterality Date   CHOLECYSTECTOMY     EXTENSOR TENDON OF FOREARM / WRIST REPAIR  07/13/1988   left   HIP PINNING,CANNULATED Right 11/12/2014   Procedure: PERCUTANEOUS PINNING RIGHT HIP ;  Surgeon: Dempsey Sensor, MD;  Location: [facility];  Service: Orthopedics;  Laterality: Right;    Family History  Problem Relation Age of Onset   Heart attack Father 2   Social History:  reports that he has never smoked. He has never used smokeless tobacco. He reports that he does not drink alcohol and does not use drugs.  Allergies:  Allergies  Allergen Reactions   Tetanus Toxoid Rash    Medications Prior to Admission  Medication Sig Dispense Refill   diltiazem  (CARDIZEM  CD) 180 MG 24 hr capsule Take 1 capsule (180 mg total) by mouth daily. 90 capsule 3   hydrochlorothiazide  (HYDRODIURIL ) 12.5 MG tablet Take 1 tablet (12.5 mg total) by mouth 2 (two) times daily. 180 tablet 3   apixaban  (ELIQUIS ) 5 MG TABS tablet Take 1 tablet (5 mg total) by mouth 2 (two) times daily. Restart 09/02/23 180 tablet 3    No results found for this or any previous visit (from the past 48 hours). No results found.  Review of Systems   Gastrointestinal:  Positive for anal bleeding.  All other systems reviewed and are negative.   Blood pressure (!) 151/96, pulse 81, temperature 98.6 F (37 C), temperature source Oral, resp. rate (!) 21, height 6' 2 (1.88 m), weight 95.3 kg, SpO2 96%. Physical Exam  GENERAL: The patient is AO x3, in no acute distress. HEENT: Head is normocephalic and atraumatic. EOMI are intact. Mouth is well hydrated and without lesions. NECK: Supple. No masses LUNGS: Clear to auscultation. No presence of rhonchi/wheezing/rales. Adequate chest expansion HEART: RRR, normal s1 and s2. ABDOMEN: Soft, nontender, no guarding, no peritoneal signs, and nondistended. BS +. No masses. EXTREMITIES: Without any cyanosis, clubbing, rash, lesions or edema. NEUROLOGIC: AOx3, no focal motor deficit. SKIN: no jaundice, no rashes  Assessment/Plan Calvin Fernandez is a 68 y.o. male with past medical history of A fib on Eliquis , GERD, HTN coming for evaluation of rectal bleeding. We will proceed with colonsocopy.  Toribio Eartha Flavors, MD 06/13/2024, 9:57 AM

## 2024-06-13 NOTE — Transfer of Care (Signed)
 Immediate Anesthesia Transfer of Care Note  Patient: Calvin Fernandez  Procedure(s) Performed: COLONOSCOPY POLYPECTOMY, INTESTINE  Patient Location: Short Stay  Anesthesia Type:General  Level of Consciousness: awake  Airway & Oxygen Therapy: Patient Spontanous Breathing  Post-op Assessment: Report given to RN  Post vital signs: Reviewed and stable  Last Vitals:  Vitals Value Taken Time  BP    Temp    Pulse    Resp    SpO2      Last Pain:  Vitals:   06/13/24 0959  TempSrc:   PainSc: 0-No pain      Patients Stated Pain Goal: 6 (06/13/24 0920)  Complications: No notable events documented.

## 2024-06-13 NOTE — Anesthesia Postprocedure Evaluation (Signed)
 Anesthesia Post Note  Patient: Calvin Fernandez  Procedure(s) Performed: COLONOSCOPY POLYPECTOMY, INTESTINE  Patient location during evaluation: Short Stay Anesthesia Type: General Level of consciousness: awake and alert Pain management: pain level controlled Vital Signs Assessment: post-procedure vital signs reviewed and stable Respiratory status: spontaneous breathing Cardiovascular status: blood pressure returned to baseline Postop Assessment: no apparent nausea or vomiting Anesthetic complications: no   No notable events documented.   Last Vitals:  Vitals:   06/13/24 1046 06/13/24 1058  BP: (!) 91/54 (!) 122/91  Pulse: 69   Resp: 18   Temp: 36.5 C   SpO2: 94%     Last Pain:  Vitals:   06/13/24 1058  TempSrc:   PainSc: 0-No pain                 Ahmani Prehn

## 2024-06-13 NOTE — Discharge Instructions (Signed)
 You are being discharged to home.  Resume your previous diet.  We are waiting for your pathology results.  Your physician has recommended a repeat colonoscopy for surveillance based on pathology results - will need to be off Eliquis  2 days prior to banding.

## 2024-06-13 NOTE — Op Note (Signed)
 [facility] Patient Name: Calvin Fernandez Procedure Date: 06/13/2024 9:27 AM MRN: [id number] Date of Birth: 1955/08/31 Attending MD: Toribio Fortune , , [id number] CSN: [id number] Age: 68 Admit Type: Outpatient Procedure:                Colonoscopy Indications:              Rectal bleeding Providers:                Toribio Fortune, Rosina Jackolyn Dorcas Alm,                            Technician Referring MD:              Medicines:                Monitored Anesthesia Care Complications:            No immediate complications. Estimated Blood Loss:     Estimated blood loss: none. Procedure:                Pre-Anesthesia Assessment:                           - Prior to the procedure, a History and Physical                            was performed, and patient medications, allergies                            and sensitivities were reviewed. The patient's                            tolerance of previous anesthesia was reviewed.                           - The risks and benefits of the procedure and the                            sedation options and risks were discussed with the                            patient. All questions were answered and informed                            consent was obtained.                           - ASA Grade Assessment: II - A patient with mild                            systemic disease.                           After obtaining informed consent, the colonoscope                            was passed under direct vision. Throughout the  procedure, the patient's blood pressure, pulse, and                            oxygen saturations were monitored continuously. The                            PCF-HQ190L ([id number]) Peds Colon was introduced                            through the anus and advanced to the the cecum,                            identified by appendiceal orifice and ileocecal                            valve.  The colonoscopy was performed without                            difficulty. The patient tolerated the procedure                            well. The quality of the bowel preparation was                            excellent. Scope In: 10:05:18 AM Scope Out: 10:38:57 AM Scope Withdrawal Time: 0 hours 22 minutes 27 seconds  Total Procedure Duration: 0 hours 33 minutes 39 seconds  Findings:      Hemorrhoids were found on perianal exam.      Three sessile polyps were found in the ascending colon and cecum. The       polyps were 3 to 6 mm in size. These polyps were removed with a cold       snare. Resection and retrieval were complete.      Two sessile polyps were found in the descending colon. The polyps were 3       to 4 mm in size. These polyps were removed with a cold snare. Resection       and retrieval were complete.      A few small-mouthed diverticula were found in the sigmoid colon.      External and internal hemorrhoids were found during retroflexion and       during perianal exam. The hemorrhoids were medium-sized. Impression:               - Hemorrhoids found on perianal exam.                           - Three 3 to 6 mm polyps in the ascending colon and                            in the cecum, removed with a cold snare. Resected                            and retrieved.                           -  Two 3 to 4 mm polyps in the descending colon,                            removed with a cold snare. Resected and retrieved.                           - Diverticulosis in the sigmoid colon.                           - External and internal hemorrhoids. Moderate Sedation:      Per Anesthesia Care Recommendation:           - Discharge patient to home (ambulatory).                           - Resume previous diet.                           - Await pathology results.                           - Repeat colonoscopy for surveillance based on                            pathology results.                            - Restar Eliquis  tomorrow.                           - Will schedule hemorrhoid banding - will need to                            be off Eliquis  2 days prior to banding. Procedure Code(s):        --- Professional ---                           [phone], Colonoscopy, flexible; with removal of                            tumor(s), polyp(s), or other lesion(s) by snare                            technique Diagnosis Code(s):        --- Professional ---                           D12.2, Benign neoplasm of ascending colon                           D12.0, Benign neoplasm of cecum                           D12.4, Benign neoplasm of descending colon                           K64.8, Other hemorrhoids  K62.5, Hemorrhage of anus and rectum                           K57.30, Diverticulosis of large intestine without                            perforation or abscess without bleeding CPT copyright 2022 American Medical Association. All rights reserved. The codes documented in this report are preliminary and upon coder review may  be revised to meet current compliance requirements. Toribio Fortune, MD Toribio Fortune,  06/13/2024 10:50:33 AM This report has been signed electronically. Number of Addenda: 0

## 2024-06-14 ENCOUNTER — Telehealth (INDEPENDENT_AMBULATORY_CARE_PROVIDER_SITE_OTHER): Payer: Self-pay | Admitting: *Deleted

## 2024-06-14 ENCOUNTER — Encounter (INDEPENDENT_AMBULATORY_CARE_PROVIDER_SITE_OTHER): Payer: Self-pay | Admitting: *Deleted

## 2024-06-14 ENCOUNTER — Encounter (HOSPITAL_COMMUNITY): Payer: Self-pay | Admitting: Gastroenterology

## 2024-06-14 LAB — SURGICAL PATHOLOGY

## 2024-06-14 NOTE — Telephone Encounter (Signed)
 Per TCS op note patient will schedule hemorrhoid banding - will need to be off Eliquis  2 days prior to banding.

## 2024-06-15 ENCOUNTER — Ambulatory Visit (INDEPENDENT_AMBULATORY_CARE_PROVIDER_SITE_OTHER): Payer: Self-pay | Admitting: Gastroenterology

## 2024-06-15 NOTE — Progress Notes (Signed)
 3 yr TCS noted in recall Patient result letter mailed procedure note and pathology result faxed to PCP

## 2024-06-16 ENCOUNTER — Telehealth (INDEPENDENT_AMBULATORY_CARE_PROVIDER_SITE_OTHER): Payer: Self-pay

## 2024-06-16 DIAGNOSIS — R972 Elevated prostate specific antigen [PSA]: Secondary | ICD-10-CM | POA: Diagnosis not present

## 2024-06-16 DIAGNOSIS — I10 Essential (primary) hypertension: Secondary | ICD-10-CM | POA: Diagnosis not present

## 2024-06-16 DIAGNOSIS — Z6830 Body mass index (BMI) 30.0-30.9, adult: Secondary | ICD-10-CM | POA: Diagnosis not present

## 2024-06-16 DIAGNOSIS — R748 Abnormal levels of other serum enzymes: Secondary | ICD-10-CM | POA: Diagnosis not present

## 2024-06-16 NOTE — Telephone Encounter (Addendum)
 Hello, this patient is scheduled for a hemorrhoid banding on 06/29/2024, and we need to know if ok for the patient to hold his Eliquis  5 mg bid for two weeks after his procedure. Please advise.    Patient called back this morning and states he tried reaching out to Dr. Alvan regarding, if it is OK to keep holding for 2 weeks AFTER banding. He says Dr. Alvan told him we needed to send a clearance form to them for this. I will send a request to the Cardiologist office.     I spoke with the patient and made him aware per Dr. Eartha, He can hold the Eliquis  for 2 days before the banding - no need to consult Dr. Alvan for this. However, he should reach Dr. Alvan and ask if it is OK to keep holding for 2 weeks AFTER banding. Patient made aware and will follow through on this.

## 2024-06-16 NOTE — Telephone Encounter (Signed)
 Patient has appointment for hemorrhoid banding on 06/29/2024.  Patient wanted to make sure he understood that he would need to stop the Eliquis  two days prior to the banding procedure. He wanted to know if he needed to consult with Dr. Ethel Ross for this or is it ok to just hold it? Also, he says he thought you had suggested that he hold it two days prior and a week after the procedure, incase he had any issues with bleeding.   Please advise.

## 2024-06-16 NOTE — Telephone Encounter (Signed)
 Hi, He can hold the Eliquis  for 2 days before the banding - no need to consult Dr. Alvan for this. However, he should reach Dr. Alvan and ask if it is OK to keep holding for 2 weeks AFTER banding. Thanks

## 2024-06-28 NOTE — Telephone Encounter (Signed)
 Will route to GI team so they are aware. Discussed with Dr. Alvan, please see his reply. Will otherwise remove from preop APP box.

## 2024-06-28 NOTE — Telephone Encounter (Signed)
 Ok to hold eliquis  as engineer, materials for procedure  JINNY Ross MD

## 2024-06-28 NOTE — Telephone Encounter (Signed)
 Thanks! Christiane Ha

## 2024-06-28 NOTE — Telephone Encounter (Signed)
Thanks to all

## 2024-06-28 NOTE — Telephone Encounter (Signed)
 Sent secure chat to MD, awaiting reply.

## 2024-06-29 ENCOUNTER — Ambulatory Visit: Admitting: Gastroenterology

## 2024-06-29 ENCOUNTER — Encounter: Payer: Self-pay | Admitting: Gastroenterology

## 2024-06-29 ENCOUNTER — Ambulatory Visit (INDEPENDENT_AMBULATORY_CARE_PROVIDER_SITE_OTHER): Admitting: Gastroenterology

## 2024-06-29 VITALS — BP 161/82 | HR 64 | Temp 98.0°F | Ht 74.0 in | Wt 215.4 lb

## 2024-06-29 DIAGNOSIS — K625 Hemorrhage of anus and rectum: Secondary | ICD-10-CM

## 2024-06-29 DIAGNOSIS — K640 First degree hemorrhoids: Secondary | ICD-10-CM

## 2024-06-29 NOTE — Patient Instructions (Addendum)
 Continue to avoid straining. Limit toilet time to 2-3 minutes at the most.   Avoid constipation. Take 2 tablespoons of natural wheat bran, natural oat bran, flax, Benefiber or any over the counter fiber supplement and increase your water  intake to 7-8 glasses daily.  You have the highest risk of bleeding in about 10-14 days.  For now you can resume your Eliquis , if you have significant amount of bleeding during that time window and your feeling of the toilet bowl please let me know and go ahead and hold your Eliquis  for 1-2 days until we get back with you.  You can also sit on a cold pack and/or lay down with your legs above your heart to help reduce bleeding.  Occasionally, you may have more bleeding than usual after the banding procedure. This is often from the untreated hemorrhoids rather than the treated one. Don't be concerned if there is a tablespoon or so of blood. If there is more blood than this, lie flat with your bottom higher than your head and apply an ice pack to the area. If the bleeding does not stop within a half an hour or if you feel faint, have severe pain, chills, fever or difficulty passing urine (very rare) or other problems, you should call us  at [phone] or report to the nearest emergency room. Please call me with any concerns!  The procedure you have had should have been relatively painless since the banding of the area involved does not have nerve endings and there is no pain sensation. The rubber band cuts off the blood supply to the hemorrhoid and the band may fall off as soon as 48 hours after the banding (the band may occasionally be seen in the toilet bowl following a bowel movement). You may notice a temporary feeling of fullness in the rectum which should respond adequately to plain Tylenol  or Motrin.  I will see you back in 3 weeks for additional banding.  If there are any new recommendations from Dr. Eartha or new provider I will let you know.  Charmaine Melia,  MSN, FNP-BC, AGACNP-BC [facility]

## 2024-06-29 NOTE — Progress Notes (Signed)
° °  [facility] Banding Procedure Note:   Calvin Fernandez is a 68 y.o. male presenting today for consideration of hemorrhoid banding.   Last colonoscopy 06/13/2024. - Hemorrhoids found on perianal exam.  - Three 3 to 6 mm polyps in the ascending colon and in the cecum, removed with a cold snare. Resected and retrieved.  - Two 3 to 4 mm polyps in the descending colon, removed with a cold snare. Resected and retrieved. - Diverticulosis in the sigmoid colon.  - External and internal hemorrhoids. - Path: Fragments of sessile serrated polyp without dysplasia and tubular adenomas - Recommend repeat colonoscopy in 3 years.  Latex Allergy: NO  Interval History: States he had rectal bleeding for about 2 months but stopped just before his colonoscopy and has not had any recurrence.  He has held his Eliquis  for 2 days prior  The patient presents with symptomatic grade 1 hemorrhoids, unresponsive to maximal medical therapy, requesting rubber band ligation of his/her hemorrhoidal disease. All risks, benefits, and alternative forms of therapy were described and informed consent was obtained.  In the left lateral decubitus position anoscopic examination revealed large engorged grade 1 hemorrhoids in all 3 hemorrhoid columns.  Evidence of skin tags present which are residual external hemorrhoids.  The decision was made to band the right anterior internal hemorrhoid, and the [facility] O'Regan System was used to perform band ligation without complication. Digital anorectal examination was then performed to assure proper positioning of the band, and to adjust the banded tissue as required. The patient was discharged home without pain or other issues. Dietary and behavioral recommendations were given along with follow-up instructions. The patient will return in 3 weeks for followup and possible additional banding as required.  No complications were encountered and the patient tolerated the procedure well.    Charmaine Melia,  MSN, FNP-BC, AGACNP-BC [facility]

## 2024-07-04 ENCOUNTER — Ambulatory Visit (INDEPENDENT_AMBULATORY_CARE_PROVIDER_SITE_OTHER): Admitting: Gastroenterology

## 2024-07-18 ENCOUNTER — Other Ambulatory Visit (HOSPITAL_COMMUNITY): Payer: Self-pay | Admitting: Physician Assistant

## 2024-08-11 ENCOUNTER — Telehealth: Payer: Self-pay | Admitting: Cardiology

## 2024-08-11 ENCOUNTER — Other Ambulatory Visit (HOSPITAL_COMMUNITY): Payer: Self-pay | Admitting: *Deleted

## 2024-08-11 MED ORDER — APIXABAN 5 MG PO TABS: 5.0000 mg | ORAL_TABLET | Freq: Two times a day (BID) | ORAL | 2 refills | Status: AC

## 2024-08-11 NOTE — Telephone Encounter (Signed)
" °*  STAT* If patient is at the pharmacy, call can be transferred to refill team.   1. Which medications need to be refilled? (please list name of each medication and dose if known)   apixaban  (ELIQUIS ) 5 MG TABS tablet Take 1 tablet (5 mg total) by mouth 2 (two) times daily. Restart 09/02/23    hydrochlorothiazide  (HYDRODIURIL ) 12.5 MG tablet Take 1 tablet (12.5 mg total) by mouth 2 (two) times daily.     4. Which pharmacy/location (including street and city if local pharmacy) is medication to be sent to?  [id number] - , [place] - [address] AT [facility] [address]     5. Do they need a 30 day or 90 day supply? 90   "

## 2024-08-14 MED ORDER — HYDROCHLOROTHIAZIDE 12.5 MG PO TABS: 12.5000 mg | ORAL_TABLET | Freq: Two times a day (BID) | ORAL | 3 refills | Status: AC

## 2024-08-14 NOTE — Telephone Encounter (Signed)
 Eliquis  refill was sent 08/11/24, refill for hydrochlorothiazide  will be sent in.

## 2024-08-17 ENCOUNTER — Ambulatory Visit: Admitting: Gastroenterology

## 2024-08-17 ENCOUNTER — Encounter: Payer: Self-pay | Admitting: Gastroenterology

## 2024-08-17 ENCOUNTER — Encounter: Payer: Self-pay | Admitting: *Deleted

## 2024-08-17 VITALS — BP 139/88 | HR 68 | Temp 98.2°F | Ht 74.0 in | Wt 209.6 lb

## 2024-08-17 DIAGNOSIS — K640 First degree hemorrhoids: Secondary | ICD-10-CM

## 2024-08-17 NOTE — Progress Notes (Signed)
 SHLOK RAZ                                          MRN: [id number]   08/17/2024   The [facility] Quality Team Specialist reviewed this patient medical record for the purposes of chart review for care gap closure. The following were reviewed: chart review for care gap closure-controlling blood pressure.    [facility] Quality Team

## 2024-08-17 NOTE — Progress Notes (Addendum)
" ° °  [facility] Banding Procedure Note:   Calvin Fernandez is a 69 y.o. male presenting today for consideration of hemorrhoid banding.   Last colonoscopy 06/13/2024. - Hemorrhoids found on perianal exam.  - Three 3 to 6 mm polyps in the ascending colon and in the cecum, removed with a cold snare. Resected and retrieved.  - Two 3 to 4 mm polyps in the descending colon, removed with a cold snare. Resected and retrieved. - Diverticulosis in the sigmoid colon.  - External and internal hemorrhoids. - Path: Fragments of sessile serrated polyp without dysplasia and tubular adenomas - Recommend repeat colonoscopy in 3 years.  Latex Allergy: NO  The patient presents with symptomatic grade 1 hemorrhoids, unresponsive to maximal medical therapy, requesting rubber band ligation of his/her hemorrhoidal disease. All risks, benefits, and alternative forms of therapy were described and informed consent was obtained.  The decision was made to band the left lateral internal hemorrhoid, and the [facility] O'Regan System was used to perform band ligation without complication. Digital anorectal examination was then performed to assure proper positioning of the band, and to adjust the banded tissue as required. The patient was discharged home without pain or other issues. Dietary and behavioral recommendations were given along with follow-up instructions. The patient will return in 3-4 weeks for followup and possible additional banding as required.  No complications were encountered and the patient tolerated the procedure well.    Charmaine Melia, MSN, FNP-BC, AGACNP-BC [facility]   "

## 2024-08-17 NOTE — Patient Instructions (Addendum)
 Continue to avoid straining. Limit toilet time to 2-3 minutes at the most.   Avoid constipation. Take 2 tablespoons of natural wheat bran, natural oat bran, flax, Benefiber or any over the counter fiber supplement and increase your water  intake to 7-8 glasses daily.  You have the highest risk of bleeding in about 10-14 days. For now you can resume your Eliquis , if you have significant amount of bleeding during that time window and your feeling of the toilet bowl please let me know and go ahead and hold your Eliquis  for 1-2 days until we get back with you. You can also sit on a cold pack and/or lay down with your legs above your heart to help reduce bleeding. You may continue your Eliquis  this whole time going forward to avoid any Afib related events and just monitor for the bleeding.   Occasionally, you may have more bleeding than usual after the banding procedure. This is often from the untreated hemorrhoids rather than the treated one. Don't be concerned if there is a tablespoon or so of blood. If there is more blood than this, lie flat with your bottom higher than your head and apply an ice pack to the area. If the bleeding does not stop within a half an hour or if you feel faint, have severe pain, chills, fever or difficulty passing urine (very rare) or other problems, you should call us  at [phone] or report to the nearest emergency room.  Please call me with any concerns!  The procedure you have had should have been relatively painless since the banding of the area involved does not have nerve endings and there is no pain sensation. The rubber band cuts off the blood supply to the hemorrhoid and the band may fall off as soon as 48 hours after the banding (the band may occasionally be seen in the toilet bowl following a bowel movement). You may notice a temporary feeling of fullness in the rectum which should respond adequately to plain Tylenol  or Motrin.  I will see you back in 3-4 weeks for  additional banding.  Charmaine Melia, MSN, FNP-BC, AGACNP-BC [facility]

## 2024-09-25 ENCOUNTER — Encounter: Admitting: Gastroenterology
